# Patient Record
Sex: Female | Born: 1970
Health system: Southern US, Community
[De-identification: ages and names within clinical notes are randomized; demographics above are authoritative.]

## PROBLEM LIST (undated history)

## (undated) DIAGNOSIS — L0291 Cutaneous abscess, unspecified: Secondary | ICD-10-CM

## (undated) DIAGNOSIS — E119 Type 2 diabetes mellitus without complications: Secondary | ICD-10-CM

## (undated) DIAGNOSIS — Z720 Tobacco use: Secondary | ICD-10-CM

---

## 2015-06-03 ENCOUNTER — Inpatient Hospital Stay (HOSPITAL_COMMUNITY)
Admission: EM | Admit: 2015-06-03 | Discharge: 2015-06-10 | DRG: 854 | Disposition: A | Discharge status: Home / Self Care | Payer: Self-pay | Attending: Internal Medicine | Admitting: Internal Medicine

## 2015-06-03 ENCOUNTER — Encounter (HOSPITAL_COMMUNITY): Payer: Self-pay | Admitting: Emergency Medicine

## 2015-06-03 DIAGNOSIS — R739 Hyperglycemia, unspecified: Secondary | ICD-10-CM | POA: Diagnosis present

## 2015-06-03 DIAGNOSIS — L0390 Cellulitis, unspecified: Secondary | ICD-10-CM

## 2015-06-03 DIAGNOSIS — L03312 Cellulitis of back [any part except buttock]: Secondary | ICD-10-CM | POA: Diagnosis present

## 2015-06-03 DIAGNOSIS — E119 Type 2 diabetes mellitus without complications: Secondary | ICD-10-CM

## 2015-06-03 DIAGNOSIS — Z720 Tobacco use: Secondary | ICD-10-CM | POA: Diagnosis present

## 2015-06-03 DIAGNOSIS — R7989 Other specified abnormal findings of blood chemistry: Secondary | ICD-10-CM

## 2015-06-03 DIAGNOSIS — A419 Sepsis, unspecified organism: Principal | ICD-10-CM | POA: Diagnosis present

## 2015-06-03 DIAGNOSIS — F17210 Nicotine dependence, cigarettes, uncomplicated: Secondary | ICD-10-CM | POA: Diagnosis present

## 2015-06-03 DIAGNOSIS — E1165 Type 2 diabetes mellitus with hyperglycemia: Secondary | ICD-10-CM | POA: Diagnosis present

## 2015-06-03 DIAGNOSIS — N179 Acute kidney failure, unspecified: Secondary | ICD-10-CM | POA: Diagnosis present

## 2015-06-03 DIAGNOSIS — Z833 Family history of diabetes mellitus: Secondary | ICD-10-CM

## 2015-06-03 DIAGNOSIS — L02211 Cutaneous abscess of abdominal wall: Secondary | ICD-10-CM | POA: Diagnosis present

## 2015-06-03 DIAGNOSIS — E876 Hypokalemia: Secondary | ICD-10-CM | POA: Diagnosis present

## 2015-06-03 DIAGNOSIS — Z794 Long term (current) use of insulin: Secondary | ICD-10-CM

## 2015-06-03 DIAGNOSIS — K219 Gastro-esophageal reflux disease without esophagitis: Secondary | ICD-10-CM | POA: Diagnosis present

## 2015-06-03 DIAGNOSIS — J449 Chronic obstructive pulmonary disease, unspecified: Secondary | ICD-10-CM | POA: Diagnosis present

## 2015-06-03 DIAGNOSIS — B9562 Methicillin resistant Staphylococcus aureus infection as the cause of diseases classified elsewhere: Secondary | ICD-10-CM | POA: Diagnosis present

## 2015-06-03 DIAGNOSIS — L02212 Cutaneous abscess of back [any part, except buttock]: Secondary | ICD-10-CM | POA: Diagnosis present

## 2015-06-03 DIAGNOSIS — IMO0001 Reserved for inherently not codable concepts without codable children: Secondary | ICD-10-CM | POA: Insufficient documentation

## 2015-06-03 DIAGNOSIS — L0291 Cutaneous abscess, unspecified: Secondary | ICD-10-CM | POA: Diagnosis present

## 2015-06-03 HISTORY — DX: Type 2 diabetes mellitus without complications: E11.9

## 2015-06-03 HISTORY — DX: Cutaneous abscess, unspecified: L02.91

## 2015-06-03 HISTORY — DX: Tobacco use: Z72.0

## 2015-06-03 LAB — CBG MONITORING, ED
GLUCOSE-CAPILLARY: 276 mg/dL — AB (ref 65–99)
Glucose-Capillary: 535 mg/dL — ABNORMAL HIGH (ref 65–99)

## 2015-06-03 LAB — BASIC METABOLIC PANEL
ANION GAP: 16 — AB (ref 5–15)
BUN: 11 mg/dL (ref 6–20)
CALCIUM: 8.8 mg/dL — AB (ref 8.9–10.3)
CO2: 23 mmol/L (ref 22–32)
Chloride: 93 mmol/L — ABNORMAL LOW (ref 101–111)
Creatinine, Ser: 0.55 mg/dL (ref 0.44–1.00)
Glucose, Bld: 511 mg/dL — ABNORMAL HIGH (ref 65–99)
POTASSIUM: 4.1 mmol/L (ref 3.5–5.1)
Sodium: 132 mmol/L — ABNORMAL LOW (ref 135–145)

## 2015-06-03 LAB — CBC
HEMATOCRIT: 38.9 % (ref 36.0–46.0)
HEMOGLOBIN: 13.5 g/dL (ref 12.0–15.0)
MCH: 31.4 pg (ref 26.0–34.0)
MCHC: 34.7 g/dL (ref 30.0–36.0)
MCV: 90.5 fL (ref 78.0–100.0)
Platelets: 346 10*3/uL (ref 150–400)
RBC: 4.3 MIL/uL (ref 3.87–5.11)
RDW: 12.2 % (ref 11.5–15.5)
WBC: 15.5 10*3/uL — ABNORMAL HIGH (ref 4.0–10.5)

## 2015-06-03 MED ORDER — LIDOCAINE-EPINEPHRINE (PF) 2 %-1:200000 IJ SOLN
20.0000 mL | Freq: Once | INTRAMUSCULAR | Status: AC
  Administered 2015-06-04: 20 mL
  Filled 2015-06-03: qty 20

## 2015-06-03 MED ORDER — VANCOMYCIN HCL IN DEXTROSE 1-5 GM/200ML-% IV SOLN
1000.0000 mg | Freq: Once | INTRAVENOUS | Status: AC
  Administered 2015-06-03: 1000 mg via INTRAVENOUS
  Filled 2015-06-03: qty 200

## 2015-06-03 MED ORDER — SODIUM CHLORIDE 0.9 % IV BOLUS (SEPSIS)
1000.0000 mL | Freq: Once | INTRAVENOUS | Status: AC
  Administered 2015-06-03: 1000 mL via INTRAVENOUS

## 2015-06-03 NOTE — ED Notes (Signed)
Pt with draining abscess to middle of back; pt sts hx of similar and has DM

## 2015-06-03 NOTE — ED Provider Notes (Signed)
CSN: [id number]     Arrival date & time 06/03/15  1609 History  By signing my name below, I, Sara Marshall, attest that this documentation has been prepared under the direction and in the presence of Shon Batonourtney F Nickie Warwick, MD. Electronically Signed: Bethel BornBritney Marshall, ED Scribe. 06/04/2015. 1:07 AM   Chief Complaint  Patient presents with  . Abscess    The history is provided by the patient. A language interpreter was used ([place]).   Sara Marshall is a 45 y.o. female with history of DM who presents to the Emergency Department complaining of a painful and draining abscess at the upper mid back with onset 2 days ago. The pain has improved since initial onset. Pt states that she gets frequent abscesses but has never had to be hospitalized for one. Associated symptoms include chills. Pt denies measured fever. She has no PCP. Her DM is treated with pills but she is unsure which ones. Pt states that she does not measure her blood sugar.  NKDA.   Past Medical History  Diagnosis Date  . Diabetes mellitus without complication (HCC)   . Tobacco abuse    History reviewed. No pertinent past surgical history. Family History  Problem Relation Age of Onset  . Diabetes Mother    Social History  Substance Use Topics  . Smoking status: Current Every Day Smoker  . Smokeless tobacco: None  . Alcohol Use: No   OB History    No data available     Review of Systems  Constitutional: Positive for chills. Negative for fever.  Respiratory: Negative for shortness of breath.   Cardiovascular: Negative for chest pain.  Skin: Positive for color change and wound (painful draining abscess at the upper back).  All other systems reviewed and are negative.  Allergies  Review of patient's allergies indicates no known allergies.  Home Medications   Prior to Admission medications   Medication Sig Start Date End Date Taking? Authorizing Provider  PRESCRIPTION MEDICATION Take 1 tablet by mouth 2 (two) times  daily.   Yes Historical Provider, MD   BP 116/74 mmHg  Pulse 107  Temp(Src) 98.3 F (36.8 C) (Oral)  Resp 16  SpO2 98% Physical Exam  Constitutional: She is oriented to person, place, and time. She appears well-developed and well-nourished.  HENT:  Head: Normocephalic and atraumatic.  Cardiovascular: Regular rhythm and normal heart sounds.   tachycardia  Pulmonary/Chest: Effort normal and breath sounds normal. No respiratory distress. She has no wheezes.  Abdominal: Soft. Bowel sounds are normal. There is no tenderness. There is no rebound.  Neurological: She is alert and oriented to person, place, and time.  Skin: Skin is warm and dry.  8 cm indurated lesion just left of the mid thoracic spine, tender to palpation, fluctuant, spontaneous pustular drainage with mild surrounding erythema Additional indurated lesions left lower quadrant approximately 4 cm with mild erythema and pustular drainage noted  Psychiatric: She has a normal mood and affect.  Nursing note and vitals reviewed.   ED Course  .Marland Kitchen.Incision and Drainage Date/Time: 06/04/2015 1:29 AM Performed by: Shon BatonHORTON, Jalani Rominger F Authorized by: Shon BatonHORTON, Levie Wages F Consent: Verbal consent obtained. Risks and benefits: risks, benefits and alternatives were discussed Consent given by: patient Type: abscess Body area: trunk Location details: back Anesthesia: local infiltration Local anesthetic: lidocaine 1% with epinephrine Anesthetic total: 10 ml Patient sedated: no Scalpel size: 11 Incision type: single straight Incision depth: muscle Complexity: simple Drainage: purulent Drainage amount: copious Wound treatment: wound left open Packing material:  1/4 in iodoform gauze   (including critical care time) DIAGNOSTIC STUDIES: Oxygen Saturation is 98% on RA,  normal by my interpretation.    COORDINATION OF CARE: 11:25 PM Discussed treatment plan which includes lab work, IVF, and I&D with pt at bedside and pt agreed to  plan.  1:05 AM-Consult complete with Dr. Clyde Lundborg (Hospitalist). Patient case explained and discussed. Agrees to admit patient for further evaluation and treatment. Call ended at 1:07 AM  Labs Review Labs Reviewed  BASIC METABOLIC PANEL - Abnormal; Notable for the following:    Sodium 132 (*)    Chloride 93 (*)    Glucose, Bld 511 (*)    Calcium 8.8 (*)    Anion gap 16 (*)    All other components within normal limits  CBC - Abnormal; Notable for the following:    WBC 15.5 (*)    All other components within normal limits  CBG MONITORING, ED - Abnormal; Notable for the following:    Glucose-Capillary 535 (*)    All other components within normal limits  CBG MONITORING, ED - Abnormal; Notable for the following:    Glucose-Capillary 276 (*)    All other components within normal limits  CULTURE, BLOOD (ROUTINE X 2)  CULTURE, BLOOD (ROUTINE X 2)  WOUND CULTURE  URINALYSIS, ROUTINE W REFLEX MICROSCOPIC (NOT AT [facility])  PROCALCITONIN  PROTIME-INR  APTT  HIV ANTIBODY (ROUTINE TESTING)  URINE RAPID DRUG SCREEN, HOSP PERFORMED  HEMOGLOBIN A1C  LACTIC ACID, PLASMA  LACTIC ACID, PLASMA  I-STAT CG4 LACTIC ACID, ED  TYPE AND SCREEN    Imaging Review No results found. I have personally reviewed and evaluated these lab results as part of my medical decision-making.   EKG Interpretation None      MDM   Final diagnoses:  Abscess  Hyperglycemia    Patient presents with abscesses most notably to the back and one to the left lower quadrant. Mildly tachycardic and hyperglycemic on initial evaluation. Reports that she takes a "pill for her blood sugar." Patient was given fluids.  Repeat blood sugar 276.  Physical exam is concerning for multiple abscesses which are very large with cellulitis adjacent. While clinically she is afebrile, she does have a leukocytosis to 15.5 and evidence of systemic illness with glucose of 535 with an anion gap of 16. Abscess was I indeed at the bedside. Copious  purulent drainage in the abscess is very deep. Concern for bacteremia given multiple abscesses and leukocytosis. The patient is at high risk for worsening condition. Discussed with the hospitalist. Will admit for observation and antibiotics.  I personally performed the services described in this documentation, which was scribed in my presence. The recorded information has been reviewed and is accurate.    Shon Baton, MD 06/04/15 [phone]

## 2015-06-03 NOTE — ED Notes (Signed)
Pt does not speak [place]. Through interpreter, the following information was obtained: The patient has these abscesses frequently. The one on her back started hurting about 2 days ago. The patient is a diabetic, but is not under the care of a physician. She states she takes pills, but does not check her blood sugar and is not competent of diabetic diet.

## 2015-06-03 NOTE — ED Notes (Addendum)
POCT CBG resulted 535; Shanda BumpsJessica, RN aware

## 2015-06-04 ENCOUNTER — Observation Stay (HOSPITAL_COMMUNITY): Payer: MEDICAID

## 2015-06-04 ENCOUNTER — Observation Stay (HOSPITAL_COMMUNITY): Payer: Self-pay

## 2015-06-04 ENCOUNTER — Encounter (HOSPITAL_COMMUNITY): Payer: Self-pay | Admitting: Internal Medicine

## 2015-06-04 DIAGNOSIS — L0390 Cellulitis, unspecified: Secondary | ICD-10-CM

## 2015-06-04 DIAGNOSIS — A419 Sepsis, unspecified organism: Secondary | ICD-10-CM | POA: Diagnosis present

## 2015-06-04 DIAGNOSIS — Z720 Tobacco use: Secondary | ICD-10-CM | POA: Diagnosis present

## 2015-06-04 DIAGNOSIS — L0291 Cutaneous abscess, unspecified: Secondary | ICD-10-CM | POA: Diagnosis present

## 2015-06-04 DIAGNOSIS — R739 Hyperglycemia, unspecified: Secondary | ICD-10-CM | POA: Diagnosis present

## 2015-06-04 DIAGNOSIS — E119 Type 2 diabetes mellitus without complications: Secondary | ICD-10-CM

## 2015-06-04 LAB — URINE MICROSCOPIC-ADD ON

## 2015-06-04 LAB — GLUCOSE, CAPILLARY
GLUCOSE-CAPILLARY: 284 mg/dL — AB (ref 65–99)
GLUCOSE-CAPILLARY: 276 mg/dL — AB (ref 65–99)
GLUCOSE-CAPILLARY: 217 mg/dL — AB (ref 65–99)
Glucose-Capillary: 242 mg/dL — ABNORMAL HIGH (ref 65–99)
Glucose-Capillary: 310 mg/dL — ABNORMAL HIGH (ref 65–99)
Glucose-Capillary: 342 mg/dL — ABNORMAL HIGH (ref 65–99)

## 2015-06-04 LAB — LACTIC ACID, PLASMA
LACTIC ACID, VENOUS: 1.2 mmol/L (ref 0.5–2.0)
Lactic Acid, Venous: 0.8 mmol/L (ref 0.5–2.0)

## 2015-06-04 LAB — COMPREHENSIVE METABOLIC PANEL
ALT: 17 U/L (ref 14–54)
AST: 15 U/L (ref 15–41)
Albumin: 2.1 g/dL — ABNORMAL LOW (ref 3.5–5.0)
Alkaline Phosphatase: 71 U/L (ref 38–126)
Anion gap: 12 (ref 5–15)
BUN: 8 mg/dL (ref 6–20)
CHLORIDE: 99 mmol/L — AB (ref 101–111)
CO2: 21 mmol/L — ABNORMAL LOW (ref 22–32)
CREATININE: 0.46 mg/dL (ref 0.44–1.00)
Calcium: 7.8 mg/dL — ABNORMAL LOW (ref 8.9–10.3)
Glucose, Bld: 290 mg/dL — ABNORMAL HIGH (ref 65–99)
POTASSIUM: 4.2 mmol/L (ref 3.5–5.1)
Sodium: 132 mmol/L — ABNORMAL LOW (ref 135–145)
Total Bilirubin: 0.4 mg/dL (ref 0.3–1.2)
Total Protein: 5.4 g/dL — ABNORMAL LOW (ref 6.5–8.1)

## 2015-06-04 LAB — URINALYSIS, ROUTINE W REFLEX MICROSCOPIC
BILIRUBIN URINE: NEGATIVE
Glucose, UA: >1000 mg/dL — AB
Ketones, ur: >80 mg/dL — AB
Leukocytes, UA: NEGATIVE
NITRITE: NEGATIVE
PROTEIN: NEGATIVE mg/dL
Specific Gravity, Urine: 1.033 — ABNORMAL HIGH (ref 1.005–1.030)
pH: 6 (ref 5.0–8.0)

## 2015-06-04 LAB — RAPID URINE DRUG SCREEN, HOSP PERFORMED
AMPHETAMINES: NOT DETECTED
BARBITURATES: NOT DETECTED
BENZODIAZEPINES: NOT DETECTED
COCAINE: NOT DETECTED
Opiates: NOT DETECTED
Tetrahydrocannabinol: NOT DETECTED

## 2015-06-04 LAB — HCG, QUANTITATIVE, PREGNANCY: hCG, Beta Chain, Quant, S: <1 m[IU]/mL (ref <5)

## 2015-06-04 LAB — CBC
HEMATOCRIT: 36.1 % (ref 36.0–46.0)
Hemoglobin: 12.2 g/dL (ref 12.0–15.0)
MCH: 30.7 pg (ref 26.0–34.0)
MCHC: 33.8 g/dL (ref 30.0–36.0)
MCV: 90.7 fL (ref 78.0–100.0)
Platelets: 319 10*3/uL (ref 150–400)
RBC: 3.98 MIL/uL (ref 3.87–5.11)
RDW: 12.1 % (ref 11.5–15.5)
WBC: 14.1 10*3/uL — AB (ref 4.0–10.5)

## 2015-06-04 LAB — PROTIME-INR
INR: 1.08 (ref 0.00–1.49)
PROTHROMBIN TIME: 14.2 s (ref 11.6–15.2)

## 2015-06-04 LAB — HIV ANTIBODY (ROUTINE TESTING W REFLEX): HIV Screen 4th Generation wRfx: NONREACTIVE

## 2015-06-04 LAB — PROCALCITONIN: PROCALCITONIN: 0.35 ng/mL

## 2015-06-04 LAB — I-STAT CG4 LACTIC ACID, ED: LACTIC ACID, VENOUS: 1.49 mmol/L (ref 0.5–2.0)

## 2015-06-04 LAB — TYPE AND SCREEN
ABO/RH(D): A POS
Antibody Screen: NEGATIVE

## 2015-06-04 LAB — APTT: APTT: 26 s (ref 24–37)

## 2015-06-04 LAB — ABO/RH: ABO/RH(D): A POS

## 2015-06-04 MED ORDER — ONDANSETRON HCL 4 MG/2ML IJ SOLN
4.0000 mg | Freq: Three times a day (TID) | INTRAMUSCULAR | Status: DC | PRN
  Filled 2015-06-04: qty 2

## 2015-06-04 MED ORDER — PIPERACILLIN-TAZOBACTAM 3.375 G IVPB
3.3750 g | Freq: Three times a day (TID) | INTRAVENOUS | Status: DC
  Administered 2015-06-04 – 2015-06-06 (×8): 3.375 g via INTRAVENOUS
  Filled 2015-06-04 (×12): qty 50

## 2015-06-04 MED ORDER — SODIUM CHLORIDE 0.9 % IV BOLUS (SEPSIS)
2000.0000 mL | Freq: Once | INTRAVENOUS | Status: AC
  Administered 2015-06-04: 2000 mL via INTRAVENOUS

## 2015-06-04 MED ORDER — NICOTINE 21 MG/24HR TD PT24
21.0000 mg | MEDICATED_PATCH | Freq: Every day | TRANSDERMAL | Status: DC
Start: 2015-06-04 — End: 2015-06-10
  Administered 2015-06-04 – 2015-06-10 (×7): 21 mg via TRANSDERMAL
  Filled 2015-06-04 (×7): qty 1

## 2015-06-04 MED ORDER — INSULIN GLARGINE 100 UNIT/ML ~~LOC~~ SOLN
15.0000 [IU] | Freq: Every day | SUBCUTANEOUS | Status: DC
  Administered 2015-06-04 – 2015-06-05 (×2): 15 [IU] via SUBCUTANEOUS
  Filled 2015-06-04 (×4): qty 0.15

## 2015-06-04 MED ORDER — INSULIN GLARGINE 100 UNIT/ML ~~LOC~~ SOLN
5.0000 [IU] | Freq: Every day | SUBCUTANEOUS | Status: DC
  Administered 2015-06-04: 5 [IU] via SUBCUTANEOUS
  Filled 2015-06-04 (×3): qty 0.05

## 2015-06-04 MED ORDER — ENOXAPARIN SODIUM 40 MG/0.4ML ~~LOC~~ SOLN
40.0000 mg | SUBCUTANEOUS | Status: DC
  Administered 2015-06-04 – 2015-06-06 (×2): 40 mg via SUBCUTANEOUS
  Filled 2015-06-04 (×2): qty 0.4

## 2015-06-04 MED ORDER — LIVING WELL WITH DIABETES BOOK - IN SPANISH
Freq: Once | Status: AC
  Administered 2015-06-04: 15:00:00
  Filled 2015-06-04: qty 1

## 2015-06-04 MED ORDER — INSULIN ASPART 100 UNIT/ML ~~LOC~~ SOLN
0.0000 [IU] | Freq: Three times a day (TID) | SUBCUTANEOUS | Status: DC
  Administered 2015-06-04 (×2): 3 [IU] via SUBCUTANEOUS
  Administered 2015-06-04: 7 [IU] via SUBCUTANEOUS
  Administered 2015-06-05: 2 [IU] via SUBCUTANEOUS
  Administered 2015-06-05 (×2): 3 [IU] via SUBCUTANEOUS
  Administered 2015-06-06: 7 [IU] via SUBCUTANEOUS
  Administered 2015-06-06: 5 [IU] via SUBCUTANEOUS
  Administered 2015-06-06: 7 [IU] via SUBCUTANEOUS
  Administered 2015-06-07: 2 [IU] via SUBCUTANEOUS
  Administered 2015-06-07: 5 [IU] via SUBCUTANEOUS
  Administered 2015-06-07: 1 [IU] via SUBCUTANEOUS
  Administered 2015-06-08: 2 [IU] via SUBCUTANEOUS
  Administered 2015-06-08: 3 [IU] via SUBCUTANEOUS
  Administered 2015-06-08: 1 [IU] via SUBCUTANEOUS
  Administered 2015-06-09: 2 [IU] via SUBCUTANEOUS
  Administered 2015-06-09: 1 [IU] via SUBCUTANEOUS
  Administered 2015-06-09: 3 [IU] via SUBCUTANEOUS
  Administered 2015-06-10: 1 [IU] via SUBCUTANEOUS
  Administered 2015-06-10: 3 [IU] via SUBCUTANEOUS

## 2015-06-04 MED ORDER — SODIUM CHLORIDE 0.9 % IV SOLN
INTRAVENOUS | Status: DC
  Administered 2015-06-04 – 2015-06-09 (×9): via INTRAVENOUS

## 2015-06-04 MED ORDER — ACETAMINOPHEN 650 MG RE SUPP: 650.0000 mg | Freq: Four times a day (QID) | RECTAL | Status: DC | PRN

## 2015-06-04 MED ORDER — SODIUM CHLORIDE 0.9% FLUSH
3.0000 mL | Freq: Two times a day (BID) | INTRAVENOUS | Status: DC
  Administered 2015-06-04 – 2015-06-08 (×7): 3 mL via INTRAVENOUS

## 2015-06-04 MED ORDER — OXYCODONE-ACETAMINOPHEN 5-325 MG PO TABS
2.0000 | ORAL_TABLET | Freq: Four times a day (QID) | ORAL | Status: DC | PRN
  Administered 2015-06-08 – 2015-06-09 (×3): 2 via ORAL
  Filled 2015-06-04 (×3): qty 2

## 2015-06-04 MED ORDER — VANCOMYCIN HCL IN DEXTROSE 1-5 GM/200ML-% IV SOLN
1000.0000 mg | Freq: Two times a day (BID) | INTRAVENOUS | Status: DC
  Administered 2015-06-04 – 2015-06-06 (×5): 1000 mg via INTRAVENOUS
  Filled 2015-06-04 (×6): qty 200

## 2015-06-04 MED ORDER — ACETAMINOPHEN 325 MG PO TABS
650.0000 mg | ORAL_TABLET | Freq: Four times a day (QID) | ORAL | Status: DC | PRN
Start: 2015-06-04 — End: 2015-06-10
  Administered 2015-06-04: 650 mg via ORAL
  Filled 2015-06-04: qty 2

## 2015-06-04 MED ORDER — MORPHINE SULFATE (PF) 2 MG/ML IV SOLN
2.0000 mg | INTRAVENOUS | Status: DC | PRN
  Filled 2015-06-04: qty 1

## 2015-06-04 NOTE — Progress Notes (Signed)
Interpreter Wyvonnia DuskyGraciela Namihira for TEPP[employer] PartnersDenise, Physiological scientistinancial [place]

## 2015-06-04 NOTE — Consult Note (Signed)
[facility] Surgery Consult Note  Sara Marshall 11-15-1970  [id number].    Requesting MD: Dr. Clementeen Graham Chief Complaint/Reason for Consult: LLQ abdominal wall and upper back cellulitis/abscess  HPI:  45 y/o Hispanic Spanish speaking female with uncontrolled diabetes from [place] who is here visiting family.  She presented to [facility] secondary to worsening back abscess which occurred spontaneously.  No h/o trauma/insect bites.  She has had abscesses in the past in her back, hips, abdomen and has only had to have them drained once before in the ER.  The back abscess had been going on for >2 weeks and normally they open on their own.  She had been applying hot packs.  She said it opened, but it continued to drain a lot which made her worry.  The abdominal wall abscess in the LLQ had been going on for about 5 days, but it has not opened up.  In the ED, they did an I&D of her back abscess and it has been pouring out purulent material, they packed it with 1/4 inch iodoform gauze packing strips.  She says the pain is dramatically improved and she can actually lay on her back.  She does not see a doctor back home but intends to get one to help her control her blood sugars.  She denied taking any medications for her diabetes.  Denies h/o surgery.  No allergies.  Denies other medical problems.    ROS: All systems reviewed and otherwise negative except for as above  Family History  Problem Relation Age of Onset  . Diabetes Mother     Past Medical History  Diagnosis Date  . Diabetes mellitus without complication ([place])   . Tobacco abuse   . Abscess     History reviewed. No pertinent past surgical history.  Social History:  reports that she has been smoking.  She does not have any smokeless tobacco history on file. She reports that she does not drink alcohol or use illicit drugs.  Allergies: No Known Allergies  Medications Prior to Admission  Medication Sig Dispense Refill  . PRESCRIPTION  MEDICATION Take 1 tablet by mouth 2 (two) times daily.      Blood pressure 122/68, pulse 104, temperature 99.3 F (37.4 C), temperature source Oral, resp. rate 17, weight 77.202 kg (170 lb 3.2 oz), SpO2 100 %. Physical Exam: General: pleasant, WD/WN Hispanic female who is laying in bed in NAD HEENT: head is normocephalic, atraumatic.  Sclera are noninjected.  PERRL.  Ears and nose without any masses or lesions.  Mouth is pink and moist Heart: regular, rate, and rhythm.  No obvious murmurs, gallops, or rubs noted.  Palpable pedal pulses bilaterally Lungs: CTAB, no wheezes, rhonchi, or rales noted.  Respiratory effort nonlabored Abd: soft, NT/ND, +BS, no masses, hernias, or organomegaly MS: all 4 extremities are symmetrical with no cyanosis, clubbing, or edema. Skin: 10 cm indurated, erythematous, macerated abscess over left upper back, tender to palpation, with purulent drainage, no foul smell. Additional LLQ abdominal wall cellulitis which is approximately 5 cm which is indurated, hard, but without palpable fluctuance or active drainage.   Psych: A&Ox3 with an appropriate affect.   Results for orders placed or performed during the hospital encounter of 06/03/15 (from the past 48 hour(s))  POC CBG, ED     Status: Abnormal   Collection Time: 06/03/15  4:24 PM  Result Value Ref Range   Glucose-Capillary 535 (H) 65 - 99 mg/dL   Comment 1 Notify RN  Comment 2 Document in Chart   Basic metabolic panel     Status: Abnormal   Collection Time: 06/03/15  4:55 PM  Result Value Ref Range   Sodium 132 (L) 135 - 145 mmol/L   Potassium 4.1 3.5 - 5.1 mmol/L   Chloride 93 (L) 101 - 111 mmol/L   CO2 23 22 - 32 mmol/L   Glucose, Bld 511 (H) 65 - 99 mg/dL   BUN 11 6 - 20 mg/dL   Creatinine, Ser 0.55 0.44 - 1.00 mg/dL   Calcium 8.8 (L) 8.9 - 10.3 mg/dL   GFR calc non Af Amer >60 >60 mL/min   GFR calc Af Amer >60 >60 mL/min    Comment: (NOTE) The eGFR has been calculated using the CKD EPI  equation. This calculation has not been validated in all clinical situations. eGFR's persistently <60 mL/min signify possible Chronic Kidney Disease.    Anion gap 16 (H) 5 - 15  CBC     Status: Abnormal   Collection Time: 06/03/15  4:55 PM  Result Value Ref Range   WBC 15.5 (H) 4.0 - 10.5 K/uL   RBC 4.30 3.87 - 5.11 MIL/uL   Hemoglobin 13.5 12.0 - 15.0 g/dL   HCT 38.9 36.0 - 46.0 %   MCV 90.5 78.0 - 100.0 fL   MCH 31.4 26.0 - 34.0 pg   MCHC 34.7 30.0 - 36.0 g/dL   RDW 12.2 11.5 - 15.5 %   Platelets 346 150 - 400 K/uL  CBG monitoring, ED     Status: Abnormal   Collection Time: 06/03/15 11:29 PM  Result Value Ref Range   Glucose-Capillary 276 (H) 65 - 99 mg/dL  I-Stat CG4 Lactic Acid, ED     Status: None   Collection Time: 06/04/15 12:22 AM  Result Value Ref Range   Lactic Acid, Venous 1.49 0.5 - 2.0 mmol/L  Wound culture     Status: None (Preliminary result)   Collection Time: 06/04/15 12:45 AM  Result Value Ref Range   Specimen Description ABSCESS    Special Requests BACK    Gram Stain      MODERATE WBC PRESENT, PREDOMINANTLY PMN NO SQUAMOUS EPITHELIAL CELLS SEEN ABUNDANT GRAM POSITIVE COCCI IN CLUSTERS Performed at [facility]    Culture PENDING    Report Status PENDING   ABO/Rh     Status: None   Collection Time: 06/04/15  2:42 AM  Result Value Ref Range   ABO/RH(D) A POS   Procalcitonin     Status: None   Collection Time: 06/04/15  2:44 AM  Result Value Ref Range   Procalcitonin 0.35 ng/mL    Comment:        Interpretation: PCT (Procalcitonin) <= 0.5 ng/mL: Systemic infection (sepsis) is not likely. Local bacterial infection is possible. (NOTE)         ICU PCT Algorithm               Non ICU PCT Algorithm    ----------------------------     ------------------------------         PCT < 0.25 ng/mL                 PCT < 0.1 ng/mL     Stopping of antibiotics            Stopping of antibiotics       strongly encouraged.               strongly  encouraged.    ----------------------------     ------------------------------  PCT level decrease by               PCT < 0.25 ng/mL       >= 80% from peak PCT       OR PCT 0.25 - 0.5 ng/mL          Stopping of antibiotics                                             encouraged.     Stopping of antibiotics           encouraged.    ----------------------------     ------------------------------       PCT level decrease by              PCT >= 0.25 ng/mL       < 80% from peak PCT        AND PCT >= 0.5 ng/mL            Continuin g antibiotics                                              encouraged.       Continuing antibiotics            encouraged.    ----------------------------     ------------------------------     PCT level increase compared          PCT > 0.5 ng/mL         with peak PCT AND          PCT >= 0.5 ng/mL             Escalation of antibiotics                                          strongly encouraged.      Escalation of antibiotics        strongly encouraged.   Protime-INR     Status: None   Collection Time: 06/04/15  2:44 AM  Result Value Ref Range   Prothrombin Time 14.2 11.6 - 15.2 seconds   INR 1.08 0.00 - 1.49  APTT     Status: None   Collection Time: 06/04/15  2:44 AM  Result Value Ref Range   aPTT 26 24 - 37 seconds  Lactic acid, plasma     Status: None   Collection Time: 06/04/15  2:44 AM  Result Value Ref Range   Lactic Acid, Venous 1.2 0.5 - 2.0 mmol/L  Comprehensive metabolic panel     Status: Abnormal   Collection Time: 06/04/15  2:44 AM  Result Value Ref Range   Sodium 132 (L) 135 - 145 mmol/L   Potassium 4.2 3.5 - 5.1 mmol/L   Chloride 99 (L) 101 - 111 mmol/L   CO2 21 (L) 22 - 32 mmol/L   Glucose, Bld 290 (H) 65 - 99 mg/dL   BUN 8 6 - 20 mg/dL   Creatinine, Ser 0.46 0.44 - 1.00 mg/dL   Calcium 7.8 (L) 8.9 - 10.3 mg/dL   Total Protein 5.4 (L) 6.5 - 8.1 g/dL   Albumin 2.1 (L) 3.5 -  5.0 g/dL   AST 15 15 - 41 U/L   ALT 17 14 - 54 U/L    Alkaline Phosphatase 71 38 - 126 U/L   Total Bilirubin 0.4 0.3 - 1.2 mg/dL   GFR calc non Af Amer >60 >60 mL/min   GFR calc Af Amer >60 >60 mL/min    Comment: (NOTE) The eGFR has been calculated using the CKD EPI equation. This calculation has not been validated in all clinical situations. eGFR's persistently <60 mL/min signify possible Chronic Kidney Disease.    Anion gap 12 5 - 15  CBC     Status: Abnormal   Collection Time: 06/04/15  2:44 AM  Result Value Ref Range   WBC 14.1 (H) 4.0 - 10.5 K/uL   RBC 3.98 3.87 - 5.11 MIL/uL   Hemoglobin 12.2 12.0 - 15.0 g/dL   HCT 36.1 36.0 - 46.0 %   MCV 90.7 78.0 - 100.0 fL   MCH 30.7 26.0 - 34.0 pg   MCHC 33.8 30.0 - 36.0 g/dL   RDW 12.1 11.5 - 15.5 %   Platelets 319 150 - 400 K/uL  Type and screen [place]     Status: None   Collection Time: 06/04/15  2:45 AM  Result Value Ref Range   ABO/RH(D) A POS    Antibody Screen NEG    Sample Expiration 06/07/2015   Glucose, capillary     Status: Abnormal   Collection Time: 06/04/15  4:22 AM  Result Value Ref Range   Glucose-Capillary 276 (H) 65 - 99 mg/dL   Comment 1 Notify RN    Comment 2 Document in Chart   Urinalysis, Routine w reflex microscopic (not at [facility])     Status: Abnormal   Collection Time: 06/04/15  5:18 AM  Result Value Ref Range   Color, Urine YELLOW YELLOW   APPearance CLOUDY (A) CLEAR   Specific Gravity, Urine 1.033 (H) 1.005 - 1.030   pH 6.0 5.0 - 8.0   Glucose, UA >1000 (A) NEGATIVE mg/dL   Hgb urine dipstick LARGE (A) NEGATIVE   Bilirubin Urine NEGATIVE NEGATIVE   Ketones, ur >80 (A) NEGATIVE mg/dL   Protein, ur NEGATIVE NEGATIVE mg/dL   Nitrite NEGATIVE NEGATIVE   Leukocytes, UA NEGATIVE NEGATIVE  Urine rapid drug screen (hosp performed)     Status: None   Collection Time: 06/04/15  5:18 AM  Result Value Ref Range   Opiates NONE DETECTED NONE DETECTED   Cocaine NONE DETECTED NONE DETECTED   Benzodiazepines NONE DETECTED NONE DETECTED    Amphetamines NONE DETECTED NONE DETECTED   Tetrahydrocannabinol NONE DETECTED NONE DETECTED   Barbiturates NONE DETECTED NONE DETECTED    Comment:        DRUG SCREEN FOR MEDICAL PURPOSES ONLY.  IF CONFIRMATION IS NEEDED FOR ANY PURPOSE, NOTIFY LAB WITHIN 5 DAYS.        LOWEST DETECTABLE LIMITS FOR URINE DRUG SCREEN Drug Class       Cutoff (ng/mL) Amphetamine      1000 Barbiturate      200 Benzodiazepine   132 Tricyclics       440 Opiates          300 Cocaine          300 THC              50   Urine microscopic-add on     Status: Abnormal   Collection Time: 06/04/15  5:18 AM  Result Value Ref Range  Squamous Epithelial / LPF 0-5 (A) NONE SEEN   WBC, UA 6-30 0 - 5 WBC/hpf   RBC / HPF TOO NUMEROUS TO COUNT 0 - 5 RBC/hpf   Bacteria, UA FEW (A) NONE SEEN  Glucose, capillary     Status: Abnormal   Collection Time: 06/04/15  6:41 AM  Result Value Ref Range   Glucose-Capillary 242 (H) 65 - 99 mg/dL  Lactic acid, plasma     Status: None   Collection Time: 06/04/15  7:04 AM  Result Value Ref Range   Lactic Acid, Venous 0.8 0.5 - 2.0 mmol/L  hCG, quantitative, pregnancy     Status: None   Collection Time: 06/04/15  7:04 AM  Result Value Ref Range   hCG, Beta Chain, Quant, S <1 <5 mIU/mL    Comment:          GEST. AGE      CONC.  (mIU/mL)   <=1 WEEK        5 - 50     2 WEEKS       50 - 500     3 WEEKS       100 - 10,000     4 WEEKS     1,000 - 30,000     5 WEEKS     3,500 - 115,000   6-8 WEEKS     12,000 - 270,000    12 WEEKS     15,000 - 220,000        FEMALE AND NON-PREGNANT FEMALE:     LESS THAN 5 mIU/mL    [place] Misc Soft Tissue  06/04/2015  CLINICAL DATA:  Lower abdominal abscess for 4 days. EXAM: LIMITED ULTRASOUND OF ABDOMINAL SOFT TISSUES TECHNIQUE: Ultrasound examination of the abdominal wall soft tissues was performed in the area of clinical concern. COMPARISON:  None. FINDINGS: Ultrasound examination of the left lower abdomen soft tissues is obtained. The area of  involvement is warm and red. Images obtained demonstrate edema in the subcutaneous soft tissues with multiple hyperechoic foci consistent with soft tissue gas. No discrete loculated fluid collections to suggest abscess. No significant increased flow on color flow Doppler imaging. IMPRESSION: Ultrasound examination of area of abnormality in the left lower abdomen demonstrates soft tissue edema consistent with cellulitis. Soft tissue gas may indicate gas-forming organism or fistula. No discrete abscess collection identified. Electronically Signed   By: Lucienne Capers M.D.   On: 06/04/2015 03:32   [place] Misc Soft Tissue  06/04/2015  CLINICAL DATA:  Draining abscess to the middle of the back for 2 days. EXAM: ULTRASOUND OF CHEST SOFT TISSUES TECHNIQUE: Ultrasound examination of the chest wall soft tissues was performed in the area of clinical concern. COMPARISON:  None. FINDINGS: Ultrasound examination of the left upper back is obtained with imaging of and area of swelling and redness. Images obtained demonstrate subcutaneous soft tissue edema. No discrete loculated fluid collection to suggest an abscess. Areas of mixed echotexture with hyperechoic foci and shadowing consistent with soft tissue gas. No significant hyperemia with color flow Doppler imaging. IMPRESSION: Edema in the subcutaneous fat of the left upper back with soft tissue gas collections present. Changes are consistent with cellulitis. Soft tissue gas could be due to gas-forming organism or fistula. No discrete abscess collection is identified. Electronically Signed   By: Lucienne Capers M.D.   On: 06/04/2015 03:29      Assessment/Plan SIRS Cellulitis/abscess of left upper back, LLQ abdominal wall -Dressing changes with 1/4 iodoform gauze packing  strips to back 3 times a day while draining so much -Recommend daily showers with wounds open -Heat packs to aid in drainage, hopefully the abdominal wound will open on its own, but right now the  area is indurated and no fluctuance to drain -Wound culture pending but gram stain shows gram positive cocci in clusters  Diabetes Mellitus -She does not see a doctor, likely needs diabetes nurse educator & dietitian and Rob Bunting to interpret and she will need care set up in [place] upon discharge -We discussed that her abscesses are a result of her uncontrolled blood sugars.   Nat Christen, [facility] Surgery 06/04/2015, 11:00 AM Pager: [phone] (7am - 4:30pm M-F; 7am - 11:30am Sa/Su)

## 2015-06-04 NOTE — H&P (Addendum)
[facility] History and Physical  Sara QuantCruz Meigs ZOX:[id number] DOB: 02/26/1971 DOA: 06/03/2015  Referring physician: ED physician PCP: No PCP Per Patient  Specialists:   Chief Complaint: abscess over back and abdomen  HPI: Sara Marshall is a 45 y.o. female with PMH of tobacco abuse, diabetes mellitus, recurrent abscess, who presents with abscess over back and abdomen.  Patient speaks Spanish, cannot speak [place]. History is obtained via translated on the phone. Pt reports that she has a painful, big and draining abscess at the upper mid back which started 2 days ago. She also has another smaller abscess over left lower abdomen which started 3 days ago. Patient has subjective fever and chills. No nausea, vomiting, diarrhea, abdominal pain, chest pain, shortness breath, cough, symptoms of UTI or unilateral weakness.  Pt states that she gets frequent abscesses, but has never had to be hospitalized for one. She has no PCP. Her DM is treated with pills but she is unsure which ones. Pt states that she does not measure her blood sugar at home.  In ED, patient was found to have WBC 15.5, lactate 1.49, temperature normal, tachycardia, pseudohyponatremia with sodium 132, anion gap 16, blood sugar 511, bicarbonate 23. Patient is admitted to inpatient for further prevention treatment and observation.  # US-soft tissue of back: edema in the subcutaneous fat of the left upper back with soft tissue gas collections present. Changes are consistent with cellulitis. Soft tissue gas could be due to gas-forming organism or fistula. No discrete abscess collection is identified.  # US-soft tissue of LLQ of abdomen: soft tissue edema consistent with cellulitis. Soft tissue gas may indicate gas-forming organism or fistula. No discrete abscess collection identified.  EKG: Independently reviewed. QTC 413, occasional PVC  Where does patient live?   At home Can patient participate in ADLs?  Yes  Review of Systems:    General: has subjective fevers, chills, no changes in body weight, has fatigue HEENT: no blurry vision, hearing changes or sore throat Pulm: no dyspnea, coughing, wheezing CV: no chest pain, no palpitations Abd: no nausea, vomiting, abdominal pain, diarrhea, constipation GU: no dysuria, burning on urination, increased urinary frequency, hematuria  Ext: no leg edema Neuro: no unilateral weakness, numbness, or tingling, no vision change or hearing loss Skin: has abscess over back and abdomen. MSK: No muscle spasm, no deformity, no limitation of range of movement in spin Heme: No easy bruising.  Travel history: No recent long distant travel.  Allergy: No Known Allergies  Past Medical History  Diagnosis Date  . Diabetes mellitus without complication (HCC)   . Tobacco abuse   . Abscess     History reviewed. No pertinent past surgical history.  Social History:  reports that she has been smoking.  She does not have any smokeless tobacco history on file. She reports that she does not drink alcohol or use illicit drugs.  Family History:  Family History  Problem Relation Age of Onset  . Diabetes Mother      Prior to Admission medications   Not on File    Physical Exam: Filed Vitals:   06/03/15 2354 06/04/15 0206 06/04/15 0324 06/04/15 0408  BP: 103/69 111/73 116/74 122/68  Pulse: 92 96 100 104  Temp: 98.2 F (36.8 C)   98.4 F (36.9 C)  TempSrc: Oral   Oral  Resp: 18 21 21 17   SpO2: 99% 99% 100% 100%   General: Not in acute distress HEENT:       Eyes: PERRL, EOMI, no  scleral icterus.       ENT: No discharge from the ears and nose, no pharynx injection, no tonsillar enlargement.        Neck: No JVD, no bruit, no mass felt. Heme: No neck lymph node enlargement. Cardiac: S1/S2, RRR, No murmurs, No gallops or rubs. Pulm:  No rales, wheezing, rhonchi or rubs. Abd: Soft, nondistended, nontender, no rebound pain, no organomegaly, BS present. Ext: No pitting leg edema  bilaterally. 2+DP/PT pulse bilaterally. Musculoskeletal: No joint deformities, No joint redness or warmth, no limitation of ROM in spin. Skin: 8~9 cm indurated erythematous lesion over left upper back close to thoracic spine, tender to palpation, with spontaneous pustular drainage, looks deep. Additional indurated lesions over left lower abdomen is approximately 4 cm with mild erythema and pustular drainage.  Neuro: Alert, oriented X3, cranial nerves II-XII grossly intact, moves all extremities normally. Psych: Patient is not psychotic, no suicidal or hemocidal ideation.  Labs on Admission:  Basic Metabolic Panel:  Recent Labs Lab 06/03/15 1655 06/04/15 0244  NA 132* 132*  K 4.1 4.2  CL 93* 99*  CO2 23 21*  GLUCOSE 511* 290*  BUN 11 8  CREATININE 0.55 0.46  CALCIUM 8.8* 7.8*   Liver Function Tests:  Recent Labs Lab 06/04/15 0244  AST 15  ALT 17  ALKPHOS 71  BILITOT 0.4  PROT 5.4*  ALBUMIN 2.1*   No results for input(s): LIPASE, AMYLASE in the last 168 hours. No results for input(s): AMMONIA in the last 168 hours. CBC:  Recent Labs Lab 06/03/15 1655 06/04/15 0244  WBC 15.5* 14.1*  HGB 13.5 12.2  HCT 38.9 36.1  MCV 90.5 90.7  PLT 346 319   Cardiac Enzymes: No results for input(s): CKTOTAL, CKMB, CKMBINDEX, TROPONINI in the last 168 hours.  BNP (last 3 results) No results for input(s): BNP in the last 8760 hours.  ProBNP (last 3 results) No results for input(s): PROBNP in the last 8760 hours.  CBG:  Recent Labs Lab 06/03/15 1624 06/03/15 2329 06/04/15 0422  GLUCAP 535* 276* 276*    Radiological Exams on Admission: [place] Misc Soft Tissue  06/04/2015  CLINICAL DATA:  Lower abdominal abscess for 4 days. EXAM: LIMITED ULTRASOUND OF ABDOMINAL SOFT TISSUES TECHNIQUE: Ultrasound examination of the abdominal wall soft tissues was performed in the area of clinical concern. COMPARISON:  None. FINDINGS: Ultrasound examination of the left lower abdomen soft tissues  is obtained. The area of involvement is warm and red. Images obtained demonstrate edema in the subcutaneous soft tissues with multiple hyperechoic foci consistent with soft tissue gas. No discrete loculated fluid collections to suggest abscess. No significant increased flow on color flow Doppler imaging. IMPRESSION: Ultrasound examination of area of abnormality in the left lower abdomen demonstrates soft tissue edema consistent with cellulitis. Soft tissue gas may indicate gas-forming organism or fistula. No discrete abscess collection identified. Electronically Signed   By: Burman Nieves M.D.   On: 06/04/2015 03:32   [place] Misc Soft Tissue  06/04/2015  CLINICAL DATA:  Draining abscess to the middle of the back for 2 days. EXAM: ULTRASOUND OF CHEST SOFT TISSUES TECHNIQUE: Ultrasound examination of the chest wall soft tissues was performed in the area of clinical concern. COMPARISON:  None. FINDINGS: Ultrasound examination of the left upper back is obtained with imaging of and area of swelling and redness. Images obtained demonstrate subcutaneous soft tissue edema. No discrete loculated fluid collection to suggest an abscess. Areas of mixed echotexture with hyperechoic foci and shadowing consistent  with soft tissue gas. No significant hyperemia with color flow Doppler imaging. IMPRESSION: Edema in the subcutaneous fat of the left upper back with soft tissue gas collections present. Changes are consistent with cellulitis. Soft tissue gas could be due to gas-forming organism or fistula. No discrete abscess collection is identified. Electronically Signed   By: Burman Nieves M.D.   On: 06/04/2015 03:29    Assessment/Plan Principal Problem:   Abscess and cellulitis Active Problems:   Diabetes mellitus without complication (HCC)   Tobacco abuse   Sepsis (HCC)   Hyperglycemia   Sepsis due to abscess and cellulitis over left upper back and left lower abdomen: pt is septic with leukocytosis and tachycardia.  Lactate level is normal. Hemodynamic stable. The lesion in left upper back looks deep, may need surgical debridement.   - will admit to tele bed for observation. - Empiric antimicrobial treatment with vancomycin and Zosyn per pharmacy - PRN Zofran for nausea, morphine and Percocet for pain - Blood cultures x 2  - wound care consult - wound culture was sent out by EDP - will get Procalcitonin and trend lactic acid levels per sepsis protocol. - IVF: 3.0L of NS bolus in ED, followed by 125 cc/h  - Preg test - Please call surgeon in AM.   DM-II/hyperglycemia/elevated anion gap: Last A1c is not on record. Not sure what medication patient is taking at home. pt has elevated anion gap of 16, but normal bicarbonate, blood sugar is 511, at risk of developing DKA. -start lantus 5 units daily now -SSI -Check A1c -Aggressive IV fluid treatment: 3L of normal saline bolus, followed by 125 mL per hour  Tobacco abuse: -Did counseling about importance of quitting smoking -Nicotine patch  DVT ppx: SCD  Code Status: Full code Family Communication: None at bed side. Disposition Plan: Admit to inpatient   Date of Service 06/04/2015    Lorretta Harp [facility] Pager [phone]  If 7PM-7AM, please contact night-coverage [email or web address] Password [email or web address] 06/04/2015, 4:46 AM

## 2015-06-04 NOTE — Progress Notes (Signed)
[facility] PROGRESS NOTE  Sara Marshall [id number] DOB: 07/08/1970 DOA: 06/03/2015 PCP: No PCP Per Patient  Reason for to admission H&P done early this morning. 45 year old Hispanic female with history of diabetes mellitus (takes oral medications mother is prescribed, does not see a doctor, does not check CBG), ongoing tobacco use and recurrent abscesses who presented with an abscess over her upper back and abdomen for almost 2 weeks. Patient [place] in [place] and is visiting family here for a week. He reports having an abscess in her upper back that was draining pus for almost 2 weeks. Reports subjective fevers and chills but no nausea, vomiting, abdominal pain, diarrhea, chest or shortness of breath, bowel or urinary symptoms. Patient also noticed pain and swelling on her left lower abdominal wall for the past few days without any discharge or drainage. In the ED patient was afebrile but tachycardic. On ratio 15.5 K, lactate 1.49, sodium of 132, bicarbonate 23, blood glucose of 511 and anion gap of 16. Ultrasound of the upper back and right lower abdominal wall showed trace edema consistent with cellulitis along with soft tissue gas. I&D of the upper back done by ED physician and packing done. Patient admitted to hospital service for further management.   Assessment/Plan: SIRS due to cellulitis and abscess of the left upper back and left lower abdominal wall Patient does not meet criteria for sepsis on admission. I&D of upper back and on admission. Follow wound culture and blood culture. Continue empiric IV vancomycin and Zosyn. Patient still has significant induration on the upper back and induration with tenderness on the left lower abdominal wall. -Continue IV hydration. Pain control when necessary. -I have consulted [place] surgery to  evaluate and likely patient will need further I&D. -Keep nothing by mouth.  Uncontrolled type 2 diabetes mellitus with hyperglycemia and  elevated anion gap Patient reports taking oral pills that has been prescribed to her mother but does not remember the names. She does not check her CBG or see a provider. Will start her on Lantus 15 units daily along with sliding scale coverage. Check A1c. Patient would likely need insulin upon discharge. Needs diabetic teaching.  Tobacco abuse Reports smoking 6 cigarettes a day Counseled strongly on cessation.  DVT Prophylaxis: Subcutaneous Lovenox  Diet: Nothing by mouth   Code Status: Full code Family Communication: None at bedside Disposition Plan: Home once clinically improved   Consultants:  [place] surgery  Procedures:  Ultrasound of the back and left lower abdominal wall  Antibiotics:  IV vancomycin and Zosyn since 3/20  HPI/Subjective: Seen and examined. History obtained through Spanish phone interpreter. Patient complains of pain in her upper back and left lower abdomen. Admission H&P reviewed.  Objective: Filed Vitals:   06/04/15 0408 06/04/15 1001  BP: 122/68   Pulse: 104   Temp: 98.4 F (36.9 C) 99.3 F (37.4 C)  Resp: 17     Intake/Output Summary (Last 24 hours) at 06/04/15 1017 Last data filed at 06/04/15 [id number]  Gross per 24 hour  Intake 447.92 ml  Output   1000 ml  Net -552.08 ml   Filed Weights   06/04/15 0500  Weight: 77.202 kg (170 lb 3.2 oz)    Exam:   General:  Aged female not in distress  HEENT: No pallor, moist mucosa  Chest: Clear bilaterally, no added sounds  Cardiovascular: Normal S1 and S2, no murmurs  GI: Soft, nondistended, erythema with induration over left lower quadrant, no drainage, tender  Musculoskeletal: Warm, no  edema, dressing over upper back with large area of induration, status post I&D with packing done, no discharge but tender  Data Reviewed: Basic Metabolic Panel:  Recent Labs Lab 06/03/15 1655 06/04/15 0244  NA 132* 132*  K 4.1 4.2  CL 93* 99*  CO2 23 21*  GLUCOSE 511* 290*  BUN 11 8   CREATININE 0.55 0.46  CALCIUM 8.8* 7.8*   Liver Function Tests:  Recent Labs Lab 06/04/15 0244  AST 15  ALT 17  ALKPHOS 71  BILITOT 0.4  PROT 5.4*  ALBUMIN 2.1*   No results for input(s): LIPASE, AMYLASE in the last 168 hours. No results for input(s): AMMONIA in the last 168 hours. CBC:  Recent Labs Lab 06/03/15 1655 06/04/15 0244  WBC 15.5* 14.1*  HGB 13.5 12.2  HCT 38.9 36.1  MCV 90.5 90.7  PLT 346 319   Cardiac Enzymes: No results for input(s): CKTOTAL, CKMB, CKMBINDEX, TROPONINI in the last 168 hours. BNP (last 3 results) No results for input(s): BNP in the last 8760 hours.  ProBNP (last 3 results) No results for input(s): PROBNP in the last 8760 hours.  CBG:  Recent Labs Lab 06/03/15 1624 06/03/15 2329 06/04/15 0422 06/04/15 0641  GLUCAP 535* 276* 276* 242*    Recent Results (from the past 240 hour(s))  Wound culture     Status: None (Preliminary result)   Collection Time: 06/04/15 12:45 AM  Result Value Ref Range Status   Specimen Description ABSCESS  Final   Special Requests BACK  Final   Gram Stain   Final    MODERATE WBC PRESENT, PREDOMINANTLY PMN NO SQUAMOUS EPITHELIAL CELLS SEEN ABUNDANT GRAM POSITIVE COCCI IN CLUSTERS Performed at [facility]    Culture PENDING  Incomplete   Report Status PENDING  Incomplete     Studies: [place] Misc Soft Tissue  06/04/2015  CLINICAL DATA:  Lower abdominal abscess for 4 days. EXAM: LIMITED ULTRASOUND OF ABDOMINAL SOFT TISSUES TECHNIQUE: Ultrasound examination of the abdominal wall soft tissues was performed in the area of clinical concern. COMPARISON:  None. FINDINGS: Ultrasound examination of the left lower abdomen soft tissues is obtained. The area of involvement is warm and red. Images obtained demonstrate edema in the subcutaneous soft tissues with multiple hyperechoic foci consistent with soft tissue gas. No discrete loculated fluid collections to suggest abscess. No significant increased flow  on color flow Doppler imaging. IMPRESSION: Ultrasound examination of area of abnormality in the left lower abdomen demonstrates soft tissue edema consistent with cellulitis. Soft tissue gas may indicate gas-forming organism or fistula. No discrete abscess collection identified. Electronically Signed   By: Burman Nieves M.D.   On: 06/04/2015 03:32   [place] Misc Soft Tissue  06/04/2015  CLINICAL DATA:  Draining abscess to the middle of the back for 2 days. EXAM: ULTRASOUND OF CHEST SOFT TISSUES TECHNIQUE: Ultrasound examination of the chest wall soft tissues was performed in the area of clinical concern. COMPARISON:  None. FINDINGS: Ultrasound examination of the left upper back is obtained with imaging of and area of swelling and redness. Images obtained demonstrate subcutaneous soft tissue edema. No discrete loculated fluid collection to suggest an abscess. Areas of mixed echotexture with hyperechoic foci and shadowing consistent with soft tissue gas. No significant hyperemia with color flow Doppler imaging. IMPRESSION: Edema in the subcutaneous fat of the left upper back with soft tissue gas collections present. Changes are consistent with cellulitis. Soft tissue gas could be due to gas-forming organism or fistula. No discrete abscess  collection is identified. Electronically Signed   By: Burman Nieves M.D.   On: 06/04/2015 03:29    Scheduled Meds: . insulin aspart  0-9 Units Subcutaneous TID WC  . insulin glargine  5 Units Subcutaneous QHS  . nicotine  21 mg Transdermal Daily  . piperacillin-tazobactam (ZOSYN)  IV  3.375 g Intravenous Q8H  . sodium chloride flush  3 mL Intravenous Q12H  . vancomycin  1,000 mg Intravenous Q12H   Continuous Infusions: . sodium chloride 125 mL/hr at 06/04/15 0507      Time spent: 20 MINUTES    Brielyn Bosak  [facility] Pager [phone]. If 7PM-7AM, please contact night-coverage at [email or web address], password [facility] 06/04/2015, 10:17 AM

## 2015-06-04 NOTE — ED Notes (Addendum)
Transported to US.

## 2015-06-04 NOTE — ED Notes (Signed)
Dr. Horton at bedside for I&D 

## 2015-06-04 NOTE — Progress Notes (Signed)
  Will see patient with interpreter at 9:45 am on 06/05/15.  Thanks, Beryl MeagerJenny Shekera Beavers, RN, BC-ADM Inpatient Diabetes Coordinator Pager [phone] (8a-5p)

## 2015-06-04 NOTE — Progress Notes (Signed)
Pharmacy Antibiotic Note  Sara Marshall is a 45 y.o. female admitted on 06/03/2015 with abdominal abcesses and cellulitis .  Pharmacy has been consulted for Vancomycin and Zosyn  Dosing.  Vancomycin 1 g IV given in ED at  midnight  Plan: Vancomycin 1 g IV q12h Zosyn 3.375 g IV q8h     Temp (24hrs), Avg:98.2 F (36.8 C), Min:98.1 F (36.7 C), Max:98.3 F (36.8 C)   Recent Labs Lab 06/03/15 1655 06/04/15 0022  WBC 15.5*  --   CREATININE 0.55  --   LATICACIDVEN  --  1.49    CrCl cannot be calculated (Unknown ideal weight.).    No Known Allergies  Antimicrobials this admission: Vancomycin 3/20 >> Zosyn 3/21 >>  Dose adjustments this admission:   Microbiology results:  Sara Marshall, Sara Marshall Sara Marshall 06/04/2015 1:42 AM

## 2015-06-04 NOTE — ED Notes (Signed)
Patient transported to Ultrasound 

## 2015-06-04 NOTE — Progress Notes (Signed)
Pt had shower at around 17 and lst dsg change to back done.  Had 4 hour iv run of zosyn infusing.  Karie Kirks, Therapist, sports.

## 2015-06-04 NOTE — Consult Note (Signed)
WOC consulted for back and abdominal wounds, reviewed US results.  Feel that patient will most likely need surgical intervention.  Spoke with [facility] NP, she will see this patient sometime this am.  I will not consult for this reason.  If [facility] needs further assistance with wound care, they will re-consult our team.   Re consult if needed, will not follow at this time. Thanks  Reshonda Koerber [employer] RN, CWOCN [phone])

## 2015-06-04 NOTE — Progress Notes (Signed)
Inpatient Diabetes Program Recommendations  AACE/ADA: New Consensus Statement on Inpatient Glycemic Control (2015)  Target Ranges:  Prepandial:   less than 140 mg/dL      Peak postprandial:   less than 180 mg/dL (1-2 hours)      Critically ill patients:  140 - 180 mg/dL   Review of Glycemic Control:  Results for Beryle QuantAVARRO, Diantha (MRN [id number]) as of 06/04/2015 10:16  Ref. Range 06/03/2015 16:24 06/03/2015 23:29 06/04/2015 04:22 06/04/2015 06:41  Glucose-Capillary Latest Ref Range: 65-99 mg/dL 409535 (H) 811276 (H) 914276 (H) 242 (H)   Diabetes history:  Diabetes Mellitus Type 2 Outpatient Diabetes medications: None listed Current orders for Inpatient glycemic control:  Novolog sensitive tid with meals, Lantus 5 units q HS  Inpatient Diabetes Program Recommendations:    Note history.  Based on patient's weight, consider increasing Lantus to 15 units daily (0.2 units/kg).  Also consider adding Novolog meal coverage 4 units tid with meals.  Patient will likely need more affordable insulin regimen at discharge.  Consider Novolin 70/30 at D/C.  Will speak with patient today.  Thanks, Beryl MeagerJenny Soriyah Osberg, RN, BC-ADM Inpatient Diabetes Coordinator Pager [phone] (8a-5p)

## 2015-06-05 ENCOUNTER — Inpatient Hospital Stay (HOSPITAL_COMMUNITY): Payer: MEDICAID | Admitting: Anesthesiology

## 2015-06-05 ENCOUNTER — Encounter (HOSPITAL_COMMUNITY): Payer: Self-pay | Admitting: Anesthesiology

## 2015-06-05 ENCOUNTER — Encounter (HOSPITAL_COMMUNITY)
Admission: EM | Disposition: A | Discharge status: Home / Self Care | Payer: Self-pay | Source: Home / Self Care | Attending: Internal Medicine

## 2015-06-05 ENCOUNTER — Inpatient Hospital Stay (HOSPITAL_COMMUNITY): Payer: Self-pay | Admitting: Anesthesiology

## 2015-06-05 DIAGNOSIS — E119 Type 2 diabetes mellitus without complications: Secondary | ICD-10-CM

## 2015-06-05 DIAGNOSIS — A419 Sepsis, unspecified organism: Principal | ICD-10-CM

## 2015-06-05 DIAGNOSIS — L0390 Cellulitis, unspecified: Secondary | ICD-10-CM

## 2015-06-05 DIAGNOSIS — Z794 Long term (current) use of insulin: Secondary | ICD-10-CM

## 2015-06-05 DIAGNOSIS — L0291 Cutaneous abscess, unspecified: Secondary | ICD-10-CM

## 2015-06-05 HISTORY — PX: INCISION AND DRAINAGE ABSCESS: SHX5864

## 2015-06-05 LAB — HEMOGLOBIN A1C
HEMOGLOBIN A1C: 12.5 % — AB (ref 4.8–5.6)
MEAN PLASMA GLUCOSE: 312 mg/dL

## 2015-06-05 LAB — GLUCOSE, CAPILLARY
GLUCOSE-CAPILLARY: 163 mg/dL — AB (ref 65–99)
Glucose-Capillary: 229 mg/dL — ABNORMAL HIGH (ref 65–99)
Glucose-Capillary: 245 mg/dL — ABNORMAL HIGH (ref 65–99)
Glucose-Capillary: 173 mg/dL — ABNORMAL HIGH (ref 65–99)

## 2015-06-05 LAB — MRSA PCR SCREENING: MRSA BY PCR: NEGATIVE

## 2015-06-05 SURGERY — INCISION AND DRAINAGE, ABSCESS
Anesthesia: General

## 2015-06-05 MED ORDER — ONDANSETRON HCL 4 MG/2ML IJ SOLN
INTRAMUSCULAR | Status: DC | PRN
  Administered 2015-06-05: 4 mg via INTRAVENOUS

## 2015-06-05 MED ORDER — SUCCINYLCHOLINE CHLORIDE 20 MG/ML IJ SOLN
INTRAMUSCULAR | Status: DC | PRN
  Administered 2015-06-05: 120 mg via INTRAVENOUS

## 2015-06-05 MED ORDER — INSULIN STARTER KIT- SYRINGES (SPANISH)
1.0000 | Freq: Once | Status: AC
  Administered 2015-06-05: 1
  Filled 2015-06-05: qty 1

## 2015-06-05 MED ORDER — MIDAZOLAM HCL 2 MG/2ML IJ SOLN
INTRAMUSCULAR | Status: AC
  Filled 2015-06-05: qty 2

## 2015-06-05 MED ORDER — MEPERIDINE HCL 25 MG/ML IJ SOLN: 6.2500 mg | INTRAMUSCULAR | Status: DC | PRN

## 2015-06-05 MED ORDER — MIDAZOLAM HCL 2 MG/2ML IJ SOLN: 0.5000 mg | Freq: Once | INTRAMUSCULAR | Status: DC | PRN

## 2015-06-05 MED ORDER — MIDAZOLAM HCL 5 MG/5ML IJ SOLN
INTRAMUSCULAR | Status: DC | PRN
  Administered 2015-06-05: 2 mg via INTRAVENOUS

## 2015-06-05 MED ORDER — LACTATED RINGERS IV SOLN
INTRAVENOUS | Status: DC
  Administered 2015-06-05 (×2): via INTRAVENOUS

## 2015-06-05 MED ORDER — LIDOCAINE HCL (CARDIAC) 20 MG/ML IV SOLN
INTRAVENOUS | Status: DC | PRN
  Administered 2015-06-05: 100 mg via INTRAVENOUS

## 2015-06-05 MED ORDER — HYDROMORPHONE HCL 1 MG/ML IJ SOLN
0.2500 mg | INTRAMUSCULAR | Status: DC | PRN
  Administered 2015-06-05 (×4): 0.5 mg via INTRAVENOUS

## 2015-06-05 MED ORDER — PROPOFOL 10 MG/ML IV BOLUS
INTRAVENOUS | Status: DC | PRN
  Administered 2015-06-05: 200 mg via INTRAVENOUS

## 2015-06-05 MED ORDER — FENTANYL CITRATE (PF) 250 MCG/5ML IJ SOLN
INTRAMUSCULAR | Status: AC
  Filled 2015-06-05: qty 5

## 2015-06-05 MED ORDER — FENTANYL CITRATE (PF) 100 MCG/2ML IJ SOLN
INTRAMUSCULAR | Status: DC | PRN
  Administered 2015-06-05: 100 ug via INTRAVENOUS
  Administered 2015-06-05: 50 ug via INTRAVENOUS

## 2015-06-05 MED ORDER — HYDROMORPHONE HCL 1 MG/ML IJ SOLN
INTRAMUSCULAR | Status: AC
  Filled 2015-06-05: qty 1

## 2015-06-05 MED ORDER — BLOOD GLUCOSE MONITOR KIT: PACK | Status: AC

## 2015-06-05 MED ORDER — PROMETHAZINE HCL 25 MG/ML IJ SOLN: 6.2500 mg | INTRAMUSCULAR | Status: DC | PRN

## 2015-06-05 MED ORDER — SUGAMMADEX SODIUM 200 MG/2ML IV SOLN
INTRAVENOUS | Status: AC
  Filled 2015-06-05: qty 2

## 2015-06-05 MED ORDER — SUGAMMADEX SODIUM 200 MG/2ML IV SOLN
INTRAVENOUS | Status: DC | PRN
  Administered 2015-06-05: 150 mg via INTRAVENOUS

## 2015-06-05 MED ORDER — ROCURONIUM BROMIDE 100 MG/10ML IV SOLN
INTRAVENOUS | Status: DC | PRN
  Administered 2015-06-05: 30 mg via INTRAVENOUS
  Administered 2015-06-05: 10 mg via INTRAVENOUS

## 2015-06-05 SURGICAL SUPPLY — 37 items
BNDG GAUZE ELAST 4 BULKY (GAUZE/BANDAGES/DRESSINGS) IMPLANT
CANISTER SUCTION 2500CC (MISCELLANEOUS) ×3 IMPLANT
CORD ACT VALLEY LAB BOVIE ASPN (MISCELLANEOUS) ×3 IMPLANT
COVER SURGICAL LIGHT HANDLE (MISCELLANEOUS) ×6 IMPLANT
DRAPE LAPAROSCOPIC ABDOMINAL (DRAPES) ×3 IMPLANT
DRAPE LAPAROTOMY 100X72 PEDS (DRAPES) ×3 IMPLANT
DRAPE UTILITY XL STRL (DRAPES) ×9 IMPLANT
DRSG PAD ABDOMINAL 8X10 ST (GAUZE/BANDAGES/DRESSINGS) ×3 IMPLANT
ELECT CAUTERY BLADE 6.4 (BLADE) ×3 IMPLANT
ELECT REM PT RETURN 9FT ADLT (ELECTROSURGICAL) ×3
ELECTRODE REM PT RTRN 9FT ADLT (ELECTROSURGICAL) ×1 IMPLANT
GAUZE SPONGE 4X4 12PLY STRL (GAUZE/BANDAGES/DRESSINGS) IMPLANT
GLOVE BIO SURGEON STRL SZ7 (GLOVE) ×3 IMPLANT
GLOVE BIO SURGEON STRL SZ8 (GLOVE) ×3 IMPLANT
GLOVE BIOGEL PI IND STRL 7.5 (GLOVE) ×1 IMPLANT
GLOVE BIOGEL PI INDICATOR 7.5 (GLOVE) ×2
GLOVE SURG SIGNA 7.5 PF LTX (GLOVE) ×3 IMPLANT
GOWN STRL REUS W/ TWL LRG LVL3 (GOWN DISPOSABLE) ×1 IMPLANT
GOWN STRL REUS W/ TWL XL LVL3 (GOWN DISPOSABLE) ×1 IMPLANT
GOWN STRL REUS W/TWL LRG LVL3 (GOWN DISPOSABLE) ×2
GOWN STRL REUS W/TWL XL LVL3 (GOWN DISPOSABLE) ×2
KIT BASIN OR (CUSTOM PROCEDURE TRAY) ×3 IMPLANT
KIT ROOM TURNOVER OR (KITS) ×3 IMPLANT
NS IRRIG 1000ML POUR BTL (IV SOLUTION) ×3 IMPLANT
PACK GENERAL/GYN (CUSTOM PROCEDURE TRAY) ×3 IMPLANT
PACK UNIVERSAL I (CUSTOM PROCEDURE TRAY) ×3 IMPLANT
PAD ABD 8X10 STRL (GAUZE/BANDAGES/DRESSINGS) ×3 IMPLANT
PAD ARMBOARD 7.5X6 YLW CONV (MISCELLANEOUS) ×6 IMPLANT
SPECIMEN JAR SMALL (MISCELLANEOUS) IMPLANT
SPONGE GAUZE 4X4 12PLY STER LF (GAUZE/BANDAGES/DRESSINGS) ×6 IMPLANT
SWAB COLLECTION DEVICE MRSA (MISCELLANEOUS) ×3 IMPLANT
SWAB CULTURE ESWAB REG 1ML (MISCELLANEOUS) ×3 IMPLANT
TAPE CLOTH SURG 6X10 WHT LF (GAUZE/BANDAGES/DRESSINGS) ×3 IMPLANT
TOWEL OR 17X24 6PK STRL BLUE (TOWEL DISPOSABLE) ×3 IMPLANT
TOWEL OR 17X26 10 PK STRL BLUE (TOWEL DISPOSABLE) ×3 IMPLANT
TUBE ANAEROBIC SPECIMEN COL (MISCELLANEOUS) IMPLANT
YANKAUER SUCT BULB TIP NO VENT (SUCTIONS) ×3 IMPLANT

## 2015-06-05 NOTE — Progress Notes (Signed)
PROGRESS NOTE  Beryle QuantCruz Deshaies ZOX:[id number] DOB: 01/21/1971 DOA: 06/03/2015 PCP: No PCP Per Patient  HPI/Recap of past 24 hours:  Denies pain, fever subsided, npo , awaiting I&D in OR today  Assessment/Plan: Principal Problem:   Abscess and cellulitis Active Problems:   Diabetes mellitus without complication (HCC)   Tobacco abuse   Abscess   Sepsis (HCC)   Hyperglycemia  Sepsis due to abscess and cellulitis over left upper back and left lower abdomen:  pt is septic with leukocytosis and tachycardia. Lactate level is normal. Hemodynamic stable. The lesion in left upper back looks deep, may need surgical debridement.  - Empiric antimicrobial treatment with vancomycin and Zosyn per pharmacy - PRN Zofran for nausea, morphine and Percocet for pain - Blood cultures x 2  - wound care consult - wound culture was sent out by EDP - monitor Procalcitonin and trend lactic acid levels per sepsis protocol. - IVF: 3.0L of NS bolus in ED, followed by 125 cc/h  - Preg test negative -general surgery input appreciated,     DM-II/hyperglycemia/elevated anion gap:  -only on metformin at home per care everywhere, per patient has dm2 for 3559yrs, has been taking her mother's medicines, no pcp. -A1c 12.7 -Aggressive IV fluid treatment: 3L of normal saline bolus, followed by 125 mL per hour, adjust insulin, diabetic education, care manger consult.  Tobacco abuse: -Did counseling about importance of quitting smoking -Nicotine patch  DVT ppx: SCD  Code Status: full  Family Communication: patient   Disposition Plan: remain in the hospital   Consultants:  General surgery  Procedures:  I&D to back and left flank abscess  Antibiotics:  Vanc/ zosyn   Objective: BP 116/70 mmHg  Pulse 88  Temp(Src) 99.1 F (37.3 C) (Oral)  Resp 18  Wt 74.345 kg (163 lb 14.4 oz)  SpO2 98%  Intake/Output Summary (Last 24 hours) at 06/05/15 1036 Last data filed at 06/05/15 1016  Gross per 24  hour  Intake    860 ml  Output    300 ml  Net    560 ml   Filed Weights   06/04/15 0500 06/05/15 0621  Weight: 77.202 kg (170 lb 3.2 oz) 74.345 kg (163 lb 14.4 oz)    Exam:   General:  NAD  Cardiovascular: RRR  Respiratory: CTABL  Abdomen: Soft/ND/NT, positive BS  Musculoskeletal: No Edema  Neuro: aaox3  Skin: upper back and left lower abdominal wall abscesses   Data Reviewed: Basic Metabolic Panel:  Recent Labs Lab 06/03/15 1655 06/04/15 0244  NA 132* 132*  K 4.1 4.2  CL 93* 99*  CO2 23 21*  GLUCOSE 511* 290*  BUN 11 8  CREATININE 0.55 0.46  CALCIUM 8.8* 7.8*   Liver Function Tests:  Recent Labs Lab 06/04/15 0244  AST 15  ALT 17  ALKPHOS 71  BILITOT 0.4  PROT 5.4*  ALBUMIN 2.1*   No results for input(s): LIPASE, AMYLASE in the last 168 hours. No results for input(s): AMMONIA in the last 168 hours. CBC:  Recent Labs Lab 06/03/15 1655 06/04/15 0244  WBC 15.5* 14.1*  HGB 13.5 12.2  HCT 38.9 36.1  MCV 90.5 90.7  PLT 346 319   Cardiac Enzymes:   No results for input(s): CKTOTAL, CKMB, CKMBINDEX, TROPONINI in the last 168 hours. BNP (last 3 results) No results for input(s): BNP in the last 8760 hours.  ProBNP (last 3 results) No results for input(s): PROBNP in the last 8760 hours.  CBG:  Recent Labs Lab  06/04/15 0641 06/04/15 1128 06/04/15 1650 06/04/15 2149 06/05/15 0601  GLUCAP 242* 217* 310* 342* 229*    Recent Results (from the past 240 hour(s))  Wound culture     Status: None (Preliminary result)   Collection Time: 06/04/15 12:45 AM  Result Value Ref Range Status   Specimen Description ABSCESS  Final   Special Requests BACK  Final   Gram Stain   Final    MODERATE WBC PRESENT, PREDOMINANTLY PMN NO SQUAMOUS EPITHELIAL CELLS SEEN ABUNDANT GRAM POSITIVE COCCI IN CLUSTERS Performed at [facility]    Culture PENDING  Incomplete   Report Status PENDING  Incomplete     Studies: No results found.  Scheduled  Meds: . enoxaparin (LOVENOX) injection  40 mg Subcutaneous Q24H  . insulin aspart  0-9 Units Subcutaneous TID WC  . insulin glargine  15 Units Subcutaneous QHS  . nicotine  21 mg Transdermal Daily  . piperacillin-tazobactam (ZOSYN)  IV  3.375 g Intravenous Q8H  . sodium chloride flush  3 mL Intravenous Q12H  . vancomycin  1,000 mg Intravenous Q12H    Continuous Infusions: . sodium chloride 125 mL/hr at 06/04/15 0507     Time spent:  Akshitha Culmer MD, PhD  [facility] Pager [phone]. If 7PM-7AM, please contact night-coverage at [email or web address], password [facility] 06/05/2015, 10:36 AM  LOS: 1 day

## 2015-06-05 NOTE — Progress Notes (Signed)
Interpreter Wyvonnia DuskyGraciela Namihira for [facility]

## 2015-06-05 NOTE — Transfer of Care (Signed)
Immediate Anesthesia Transfer of Care Note  Patient: Sara Marshall  Procedure(s) Performed: Procedure(s): INCISION AND DRAINAGE ABSCESS/ABDOMEN AND BACK (N/A)  Patient Location: PACU  Anesthesia Type:General  Level of Consciousness: awake, alert  and oriented  Airway & Oxygen Therapy: Patient Spontanous Breathing and Patient connected to nasal cannula oxygen  Post-op Assessment: Report given to RN, Post -op Vital signs reviewed and stable and Patient moving all extremities  Post vital signs: Reviewed and stable  Last Vitals:  Filed Vitals:   06/05/15 1154 06/05/15 1910  BP: 106/61   Pulse: 89   Temp: 37.2 C 36.5 C  Resp: 16     Complications: No apparent anesthesia complications

## 2015-06-05 NOTE — Anesthesia Preprocedure Evaluation (Addendum)
Anesthesia Evaluation  Patient identified by MRN, date of birth, ID band Patient awake    Reviewed: Allergy & Precautions, NPO status   History of Anesthesia Complications Negative for: history of anesthetic complications  Airway Mallampati: II  TM Distance: >3 FB Neck ROM: Full    Dental  (+) Chipped, Dental Advisory Given   Pulmonary COPD, Current Smoker,    breath sounds clear to auscultation       Cardiovascular negative cardio ROS   Rhythm:Regular Rate:Normal     Neuro/Psych negative neurological ROS     GI/Hepatic negative GI ROS, Neg liver ROS, neg GERD  ,  Endo/Other  diabetes (glu 173)Newly diagnosed diabetes, not on home meds  Renal/GU negative Renal ROS     Musculoskeletal   Abdominal   Peds  Hematology negative hematology ROS (+)   Anesthesia Other Findings   Reproductive/Obstetrics                            Anesthesia Physical Anesthesia Plan  ASA: III  Anesthesia Plan: General   Post-op Pain Management:    Induction: Intravenous  Airway Management Planned: Oral ETT  Additional Equipment:   Intra-op Plan:   Post-operative Plan: Extubation in OR  Informed Consent: I have reviewed the patients History and Physical, chart, labs and discussed the procedure including the risks, benefits and alternatives for the proposed anesthesia with the patient or authorized representative who has indicated his/her understanding and acceptance.   Dental advisory given  Plan Discussed with: Surgeon and CRNA  Anesthesia Plan Comments: (Plan routine monitors, GETA)        Anesthesia Quick Evaluation

## 2015-06-05 NOTE — Progress Notes (Signed)
RN Marily Memosdna called to see where pt monitor that she was transferred down to short stay is, pt doesn't have the monitor on and this PACU RN did not receive the pt with a monitor.  OR charge RN was called to see if she could look in the OR suite 2 that the procedure was preformed in.  OR charge unable to find the monitor.  Informed RN will go to look in short stay to see if I am able to find it there.  RN positive pt came down with the monitor.  Will continue to look.

## 2015-06-05 NOTE — Op Note (Signed)
Preop diagnosis: Upper back abscess Abdominal wall abscess left lower quadrant Postop diagnosis: Same Procedure performed: 1 sharp debridement of back abscess (skin, subcutaneous tissue, and muscle - total area 7 x 6 x 4 cm) 2.  Sharp debridement of abdominal wall abscess (Skin, subcutaneous tissue - 7 x 7 x 4 cm) Surgeon:  Wynona LunaSUEI,Ryot Burrous K. Anesthesia: Gen. endotracheal Indications: This is a 45 year old  female with uncontrolled diabetes who presented with several weeks of a worsening back as well as a left lower quadrant abdominal wall abscess. Bedside debridement was not effective in draining the abscess adequately. She presents now to the operating room for debridement.  Description of procedure: The patient is brought to the operating room and placed in the supine position on the operating room table. After an adequate level of general anesthesia was obtained, she was turned on her right side on a beanbag. After appropriate padding was placed, her back was prepped with Betadine and draped sterile fashion. There is a 8 cm area of erythema with a central area of necrosis with some purulent drainage. I explored this after taking appropriate timeout. This seems to be a deep abscess with a considerable amount of purulent fluid. We cultured the purulent fluid. We excised skin is obtained tissue to expose the entire abscess. We debrided some necrotic muscle from the base of the wound. There seems to be some purulent fluid tracking in from the surrounding subcutaneous tissue. We extended our excision wider. We debrided until the tissue appeared to be free of any purulent fluid. We irrigated the wound thoroughly and then used cautery for hemostasis. The total area of the debridement cavity measures 7 x 6 cm and 4 cm deep. The wound was packed with 4 x 4 gauze and an ABD dressing was taped over the wound. She was then moved back to a supine position.  We then turned our attention to the left lower quadrant  abdominal wall. The area of the abscess is fairly indurated and measures about 6 cm across. We prepped this area Betadine and draped sterile fashion. Access skin was obtained tissues and we entered a large abscess containing some purulent fluid. We excised skin was obtained tissue to remove the entire abscess cavity. We cut back to healthy adipose tissue. Cautery was used for hemostasis. We irrigated the wound thoroughly and inspected for hemostasis. The wound was packed with 4 x 4 gauze and an APD dressing was taped over the wound. She was brought to the recovery room in stable condition. All sponge, instrument, and needle counts are correct.  Wilmon ArmsMatthew K. Corliss Skainssuei, MD, [facility]  General/ Trauma Surgery  06/05/2015 7:20 PM

## 2015-06-05 NOTE — Progress Notes (Signed)
Consent form explained and signed using interpreter services, Interpreter ID [phone]. Consent for Incision and Drainage of upper back and abdominal wall abcesses to be done by Dr. Magnus IvanBlackman on 06/05/15 at 1650 signed by pt.

## 2015-06-05 NOTE — Progress Notes (Addendum)
Inpatient Diabetes Program Recommendations  AACE/ADA: New Consensus Statement on Inpatient Glycemic Control (2015)  Target Ranges:  Prepandial:   less than 140 mg/dL      Peak postprandial:   less than 180 mg/dL (1-2 hours)      Critically ill patients:  140 - 180 mg/dL   Review of Glycemic Control  Inpatient Diabetes Program Recommendations:   Consult received and coordinator met with pt and interpreter at bedside to discuss A1C 12.5 and DM management.  Pt reports 18+ yr history of DM. States she "used to monitor and control her blood sugar" but has not been recently.  States she has never been on insulin for DM management.  Pt is accepting of insulin therapy at this time.  Explained to pt that bedside RN will instruct on insulin admin at regularly scheduled times.  Pt will need RX for glucose meter, strips, lancets at discharge.  Insulin starter-kit has been ordered.  Pt did not have questions/concerns at the end of our meeting.   Thank you  Raoul Pitch BSN, RN,CDE Inpatient Diabetes Coordinator [phone] (team pager)

## 2015-06-05 NOTE — Anesthesia Postprocedure Evaluation (Signed)
Anesthesia Post Note  Patient: Sara Marshall  Procedure(s) Performed: Procedure(s) (LRB): INCISION AND DRAINAGE ABSCESS/ABDOMEN AND BACK (N/A)  Patient location during evaluation: PACU Anesthesia Type: General Level of consciousness: awake and alert Pain management: pain level controlled Vital Signs Assessment: post-procedure vital signs reviewed and stable Respiratory status: spontaneous breathing, nonlabored ventilation, respiratory function stable and patient connected to nasal cannula oxygen Cardiovascular status: blood pressure returned to baseline and stable Postop Assessment: no signs of nausea or vomiting Anesthetic complications: no    Last Vitals:  Filed Vitals:   06/05/15 1930 06/05/15 1945  BP: 131/82 132/87  Pulse: 83 87  Temp:  36.5 C  Resp: 16 14    Last Pain:  Filed Vitals:   06/05/15 1953  PainSc: 6                  Kyrstin Campillo A

## 2015-06-05 NOTE — Progress Notes (Signed)
Interpreter Wyvonnia DuskyGraciela Namihira for [facility] Diabetes coordinator

## 2015-06-05 NOTE — Progress Notes (Signed)
CM CONSULT Patient is from [place] visiting family members, no PCP/ no insurance; [facility] at discharge until she returns to [place]; Leonard SchwartzB Trappehandler RN,MHA,BSN [phone]

## 2015-06-05 NOTE — Progress Notes (Signed)
Patient ID: Sara Marshall, female   DOB: 09-11-1970, 44 y.o.   MRN: [id number]     [name] [name], [name] [address]    Phone: [phone] FAX: [phone]     Subjective: NPO since MN.  T max 102.3.  Sore.   Objective:  Vital signs:  Filed Vitals:   06/04/15 1244 06/04/15 2150 06/05/15 0131 06/05/15 0621  BP: 118/62 123/79 110/64 116/70  Pulse: 95 112 87 88  Temp: 98 F (36.7 C) 102.3 F (39.1 C) 98.4 F (36.9 C) 99.1 F (37.3 C)  TempSrc: Oral Oral Oral Oral  Resp: _0 Weight:    74.345 kg (163 lb 14.4 oz)  SpO2: 98% 95% 100% 98%    Last BM Date: 06/01/15  Intake/Output   Yesterday:  03/21 0701 - 03/22 0700 In: 860 [P.O.:460; I.V.:300; IV Piggyback:100] Out: 1300 [Urine:1300] This shift: I/O last 3 completed shifts: In: 1307.9 [P.O.:460; I.V.:497.9; IV Piggyback:350] Out: 2300 [Urine:2300]    Physical Exam: General: Pt awake/alert/oriented x4 in no  acute distress Skin:  Upper back-foul smelling purulent drainage, inadequately drained, surrounding induration and cellulitis. Left abdominal wall-significant induration and cellulitis, central opening with purulent drainage.   Problem List:   Principal Problem:   Abscess and cellulitis Active Problems:   Diabetes mellitus without complication ([place])   Tobacco abuse   Abscess   Sepsis ([place])   Hyperglycemia    Results:   Labs: Results for orders placed or performed during the hospital encounter of 06/03/15 (from the past 48 hour(s))  POC CBG, ED     Status: Abnormal   Collection Time: 06/03/15  4:24 PM  Result Value Ref Range   Glucose-Capillary 535 (H) 65 - 99 mg/dL   Comment 1 Notify RN    Comment 2 Document in Chart   Basic metabolic panel     Status: Abnormal   Collection Time: 06/03/15  4:55 PM  Result Value Ref Range   Sodium 132 (L) 135 - 145 mmol/L   Potassium 4.1 3.5 - 5.1 mmol/L   Chloride 93 (L) 101 - 111  mmol/L   CO2 23 22 - 32 mmol/L   Glucose, Bld 511 (H) 65 - 99 mg/dL   BUN 11 6 - 20 mg/dL   Creatinine, Ser 0.55 0.44 - 1.00 mg/dL   Calcium 8.8 (L) 8.9 - 10.3 mg/dL   GFR calc non Af Amer >60 >60 mL/min   GFR calc Af Amer >60 >60 mL/min    Comment: (NOTE) The eGFR has been calculated using the CKD EPI equation. This calculation has not been validated in all clinical situations. eGFR's persistently <60 mL/min signify possible Chronic Kidney Disease.    Anion gap 16 (H) 5 - 15  CBC     Status: Abnormal   Collection Time: 06/03/15  4:55 PM  Result Value Ref Range   WBC 15.5 (H) 4.0 - 10.5 K/uL   RBC 4.30 3.87 - 5.11 MIL/uL   Hemoglobin 13.5 12.0 - 15.0 g/dL   HCT 38.9 36.0 - 46.0 %   MCV 90.5 78.0 - 100.0 fL   MCH 31.4 26.0 - 34.0 pg   MCHC 34.7 30.0 - 36.0 g/dL   RDW 12.2 11.5 - 15.5 %   Platelets 346 150 - 400 K/uL  CBG monitoring, ED     Status: Abnormal   Collection Time: 06/03/15 11:29 PM  Result Value Ref Range  Glucose-Capillary 276 (H) 65 - 99 mg/dL  I-Stat CG4 Lactic Acid, ED     Status: None   Collection Time: 06/04/15 12:22 AM  Result Value Ref Range   Lactic Acid, Venous 1.49 0.5 - 2.0 mmol/L  Wound culture     Status: None (Preliminary result)   Collection Time: 06/04/15 12:45 AM  Result Value Ref Range   Specimen Description ABSCESS    Special Requests BACK    Gram Stain      MODERATE WBC PRESENT, PREDOMINANTLY PMN NO SQUAMOUS EPITHELIAL CELLS SEEN ABUNDANT GRAM POSITIVE COCCI IN CLUSTERS Performed at [facility]    Culture PENDING    Report Status PENDING   Glucose, capillary     Status: Abnormal   Collection Time: 06/04/15  1:36 AM  Result Value Ref Range   Glucose-Capillary 284 (H) 65 - 99 mg/dL  ABO/Rh     Status: None   Collection Time: 06/04/15  2:42 AM  Result Value Ref Range   ABO/RH(D) A POS   Procalcitonin     Status: None   Collection Time: 06/04/15  2:44 AM  Result Value Ref Range   Procalcitonin 0.35 ng/mL    Comment:         Interpretation: PCT (Procalcitonin) <= 0.5 ng/mL: Systemic infection (sepsis) is not likely. Local bacterial infection is possible. (NOTE)         ICU PCT Algorithm               Non ICU PCT Algorithm    ----------------------------     ------------------------------         PCT < 0.25 ng/mL                 PCT < 0.1 ng/mL     Stopping of antibiotics            Stopping of antibiotics       strongly encouraged.               strongly encouraged.    ----------------------------     ------------------------------       PCT level decrease by               PCT < 0.25 ng/mL       >= 80% from peak PCT       OR PCT 0.25 - 0.5 ng/mL          Stopping of antibiotics                                             encouraged.     Stopping of antibiotics           encouraged.    ----------------------------     ------------------------------       PCT level decrease by              PCT >= 0.25 ng/mL       < 80% from peak PCT        AND PCT >= 0.5 ng/mL            Continuin g antibiotics  encouraged.       Continuing antibiotics            encouraged.    ----------------------------     ------------------------------     PCT level increase compared          PCT > 0.5 ng/mL         with peak PCT AND          PCT >= 0.5 ng/mL             Escalation of antibiotics                                          strongly encouraged.      Escalation of antibiotics        strongly encouraged.   Protime-INR     Status: None   Collection Time: 06/04/15  2:44 AM  Result Value Ref Range   Prothrombin Time 14.2 11.6 - 15.2 seconds   INR 1.08 0.00 - 1.49  APTT     Status: None   Collection Time: 06/04/15  2:44 AM  Result Value Ref Range   aPTT 26 24 - 37 seconds  HIV antibody     Status: None   Collection Time: 06/04/15  2:44 AM  Result Value Ref Range   HIV Screen 4th Generation wRfx Non Reactive Non Reactive    Comment: (NOTE) Performed At: [facility] [place], [place] [id number] Lindon Romp MD WU:[id number]   Hemoglobin A1c     Status: Abnormal   Collection Time: 06/04/15  2:44 AM  Result Value Ref Range   Hgb A1c MFr Bld 12.5 (H) 4.8 - 5.6 %    Comment: (NOTE)         Pre-diabetes: 5.7 - 6.4         Diabetes: >6.4         Glycemic control for adults with diabetes: <7.0    Mean Plasma Glucose 312 mg/dL    Comment: (NOTE) Performed At: [facility] [place], [place] [id number] Lindon Romp MD VH:[id number]   Lactic acid, plasma     Status: None   Collection Time: 06/04/15  2:44 AM  Result Value Ref Range   Lactic Acid, Venous 1.2 0.5 - 2.0 mmol/L  Comprehensive metabolic panel     Status: Abnormal   Collection Time: 06/04/15  2:44 AM  Result Value Ref Range   Sodium 132 (L) 135 - 145 mmol/L   Potassium 4.2 3.5 - 5.1 mmol/L   Chloride 99 (L) 101 - 111 mmol/L   CO2 21 (L) 22 - 32 mmol/L   Glucose, Bld 290 (H) 65 - 99 mg/dL   BUN 8 6 - 20 mg/dL   Creatinine, Ser 0.46 0.44 - 1.00 mg/dL   Calcium 7.8 (L) 8.9 - 10.3 mg/dL   Total Protein 5.4 (L) 6.5 - 8.1 g/dL   Albumin 2.1 (L) 3.5 - 5.0 g/dL   AST 15 15 - 41 U/L   ALT 17 14 - 54 U/L   Alkaline Phosphatase 71 38 - 126 U/L   Total Bilirubin 0.4 0.3 - 1.2 mg/dL   GFR calc non Af Amer >60 >60 mL/min   GFR calc Af Amer >60 >60 mL/min    Comment: (NOTE) The eGFR has been calculated using the CKD EPI equation. This calculation has not been validated  in all clinical situations. eGFR's persistently <60 mL/min signify possible Chronic Kidney Disease.    Anion gap 12 5 - 15  CBC     Status: Abnormal   Collection Time: 06/04/15  2:44 AM  Result Value Ref Range   WBC 14.1 (H) 4.0 - 10.5 K/uL   RBC 3.98 3.87 - 5.11 MIL/uL   Hemoglobin 12.2 12.0 - 15.0 g/dL   HCT 36.1 36.0 - 46.0 %   MCV 90.7 78.0 - 100.0 fL   MCH 30.7 26.0 - 34.0 pg   MCHC 33.8 30.0 - 36.0 g/dL   RDW 12.1 11.5 - 15.5 %   Platelets 319 150 - 400 K/uL   Type and screen [place]     Status: None   Collection Time: 06/04/15  2:45 AM  Result Value Ref Range   ABO/RH(D) A POS    Antibody Screen NEG    Sample Expiration 06/07/2015   Glucose, capillary     Status: Abnormal   Collection Time: 06/04/15  4:22 AM  Result Value Ref Range   Glucose-Capillary 276 (H) 65 - 99 mg/dL   Comment 1 Notify RN    Comment 2 Document in Chart   Urinalysis, Routine w reflex microscopic (not at [facility])     Status: Abnormal   Collection Time: 06/04/15  5:18 AM  Result Value Ref Range   Color, Urine YELLOW YELLOW   APPearance CLOUDY (A) CLEAR   Specific Gravity, Urine 1.033 (H) 1.005 - 1.030   pH 6.0 5.0 - 8.0   Glucose, UA >1000 (A) NEGATIVE mg/dL   Hgb urine dipstick LARGE (A) NEGATIVE   Bilirubin Urine NEGATIVE NEGATIVE   Ketones, ur >80 (A) NEGATIVE mg/dL   Protein, ur NEGATIVE NEGATIVE mg/dL   Nitrite NEGATIVE NEGATIVE   Leukocytes, UA NEGATIVE NEGATIVE  Urine rapid drug screen (hosp performed)     Status: None   Collection Time: 06/04/15  5:18 AM  Result Value Ref Range   Opiates NONE DETECTED NONE DETECTED   Cocaine NONE DETECTED NONE DETECTED   Benzodiazepines NONE DETECTED NONE DETECTED   Amphetamines NONE DETECTED NONE DETECTED   Tetrahydrocannabinol NONE DETECTED NONE DETECTED   Barbiturates NONE DETECTED NONE DETECTED    Comment:        DRUG SCREEN FOR MEDICAL PURPOSES ONLY.  IF CONFIRMATION IS NEEDED FOR ANY PURPOSE, NOTIFY LAB WITHIN 5 DAYS.        LOWEST DETECTABLE LIMITS FOR URINE DRUG SCREEN Drug Class       Cutoff (ng/mL) Amphetamine      1000 Barbiturate      200 Benzodiazepine   419 Tricyclics       379 Opiates          300 Cocaine          300 THC              50   Urine microscopic-add on     Status: Abnormal   Collection Time: 06/04/15  5:18 AM  Result Value Ref Range   Squamous Epithelial / LPF 0-5 (A) NONE SEEN   WBC, UA 6-30 0 - 5 WBC/hpf   RBC / HPF TOO NUMEROUS TO COUNT 0 - 5 RBC/hpf    Bacteria, UA FEW (A) NONE SEEN  Glucose, capillary     Status: Abnormal   Collection Time: 06/04/15  6:41 AM  Result Value Ref Range   Glucose-Capillary 242 (H) 65 - 99 mg/dL  Lactic acid, plasma  Status: None   Collection Time: 06/04/15  7:04 AM  Result Value Ref Range   Lactic Acid, Venous 0.8 0.5 - 2.0 mmol/L  hCG, quantitative, pregnancy     Status: None   Collection Time: 06/04/15  7:04 AM  Result Value Ref Range   hCG, Beta Chain, Quant, S <1 <5 mIU/mL    Comment:          GEST. AGE      CONC.  (mIU/mL)   <=1 WEEK        5 - 50     2 WEEKS       50 - 500     3 WEEKS       100 - 10,000     4 WEEKS     1,000 - 30,000     5 WEEKS     3,500 - 115,000   6-8 WEEKS     12,000 - 270,000    12 WEEKS     15,000 - 220,000        FEMALE AND NON-PREGNANT FEMALE:     LESS THAN 5 mIU/mL   Glucose, capillary     Status: Abnormal   Collection Time: 06/04/15 11:28 AM  Result Value Ref Range   Glucose-Capillary 217 (H) 65 - 99 mg/dL  Glucose, capillary     Status: Abnormal   Collection Time: 06/04/15  4:50 PM  Result Value Ref Range   Glucose-Capillary 310 (H) 65 - 99 mg/dL  Glucose, capillary     Status: Abnormal   Collection Time: 06/04/15  9:49 PM  Result Value Ref Range   Glucose-Capillary 342 (H) 65 - 99 mg/dL  Glucose, capillary     Status: Abnormal   Collection Time: 06/05/15  6:01 AM  Result Value Ref Range   Glucose-Capillary 229 (H) 65 - 99 mg/dL    Imaging / Studies: [place] Misc Soft Tissue  06/04/2015  CLINICAL DATA:  Lower abdominal abscess for 4 days. EXAM: LIMITED ULTRASOUND OF ABDOMINAL SOFT TISSUES TECHNIQUE: Ultrasound examination of the abdominal wall soft tissues was performed in the area of clinical concern. COMPARISON:  None. FINDINGS: Ultrasound examination of the left lower abdomen soft tissues is obtained. The area of involvement is warm and red. Images obtained demonstrate edema in the subcutaneous soft tissues with multiple hyperechoic foci consistent with  soft tissue gas. No discrete loculated fluid collections to suggest abscess. No significant increased flow on color flow Doppler imaging. IMPRESSION: Ultrasound examination of area of abnormality in the left lower abdomen demonstrates soft tissue edema consistent with cellulitis. Soft tissue gas may indicate gas-forming organism or fistula. No discrete abscess collection identified. Electronically Signed   By: Lucienne Capers M.D.   On: 06/04/2015 03:32   [place] Misc Soft Tissue  06/04/2015  CLINICAL DATA:  Draining abscess to the middle of the back for 2 days. EXAM: ULTRASOUND OF CHEST SOFT TISSUES TECHNIQUE: Ultrasound examination of the chest wall soft tissues was performed in the area of clinical concern. COMPARISON:  None. FINDINGS: Ultrasound examination of the left upper back is obtained with imaging of and area of swelling and redness. Images obtained demonstrate subcutaneous soft tissue edema. No discrete loculated fluid collection to suggest an abscess. Areas of mixed echotexture with hyperechoic foci and shadowing consistent with soft tissue gas. No significant hyperemia with color flow Doppler imaging. IMPRESSION: Edema in the subcutaneous fat of the left upper back with soft tissue gas collections present. Changes are consistent with cellulitis. Soft tissue gas  could be due to gas-forming organism or fistula. No discrete abscess collection is identified. Electronically Signed   By: Lucienne Capers M.D.   On: 06/04/2015 03:29    Medications / Allergies:  Scheduled Meds: . enoxaparin (LOVENOX) injection  40 mg Subcutaneous Q24H  . insulin aspart  0-9 Units Subcutaneous TID WC  . insulin glargine  15 Units Subcutaneous QHS  . nicotine  21 mg Transdermal Daily  . piperacillin-tazobactam (ZOSYN)  IV  3.375 g Intravenous Q8H  . sodium chloride flush  3 mL Intravenous Q12H  . vancomycin  1,000 mg Intravenous Q12H   Continuous Infusions: . sodium chloride 125 mL/hr at 06/04/15 0507   PRN  Meds:.acetaminophen **OR** acetaminophen, morphine injection, ondansetron, oxyCODONE-acetaminophen  Antibiotics: Anti-infectives    Start     Dose/Rate Route Frequency Ordered Stop   06/04/15 0600  vancomycin (VANCOCIN) IVPB 1000 mg/200 mL premix     1,000 mg 200 mL/hr over 60 Minutes Intravenous Every 12 hours 06/04/15 0553     06/04/15 0200  piperacillin-tazobactam (ZOSYN) IVPB 3.375 g     3.375 g 12.5 mL/hr over 240 Minutes Intravenous Every 8 hours 06/04/15 0146     06/03/15 2330  vancomycin (VANCOCIN) IVPB 1000 mg/200 mL premix     1,000 mg 200 mL/hr over 60 Minutes Intravenous  Once 06/03/15 2327 06/04/15 0052        Assessment/Plan Abscess with cellulitis back and left flank -dont think either one is drained adequately.  I&D in the OR.  Graciella at bedside to translate.  Surgical risks discussed including infection, bleeding, anesthesia risks, heart attack, DVT/PE and respiratory compromise.  The patient verbalizes understanding and wishes to proceed.   ID-Vanc/zosyn DM II-adequate glucose control to help with infection.  FEN-NPO, IVF VTE prophylaxis-SCD/lovenox Dispo-OR, would prefer to move the patient to 6N post operatively   Erby Pian, [facility] Surgery Pager [phone](7A-4:30P) For consults and floor pages call [phone](7A-4:30P)  06/05/2015 9:30 AM

## 2015-06-05 NOTE — Progress Notes (Signed)
Short Stay room 36 was the room the pt was taken to directly from her room on 3E.  This nurse personally searched the room to see if the monitor was there and it was not.  [name], RN on 3E was called back to notify her of this.  Will continue to look for monitor and call RN if I find it.

## 2015-06-06 ENCOUNTER — Encounter (HOSPITAL_COMMUNITY): Payer: Self-pay | Admitting: Surgery

## 2015-06-06 LAB — LIPID PANEL
Cholesterol: 109 mg/dL (ref 0–200)
Cholesterol: 109 mg/dL (ref 0–200)
HDL: 17 mg/dL — ABNORMAL LOW (ref >40)
HDL: 17 mg/dL — AB (ref >40)
LDL CALC: 57 mg/dL (ref 0–99)
LDL CALC: 57 mg/dL (ref 0–99)
Total CHOL/HDL Ratio: 6.4 RATIO
Total CHOL/HDL Ratio: 6.4 RATIO
Triglycerides: 175 mg/dL — ABNORMAL HIGH (ref <150)
Triglycerides: 176 mg/dL — ABNORMAL HIGH (ref <150)
VLDL: 35 mg/dL (ref 0–40)
VLDL: 35 mg/dL (ref 0–40)

## 2015-06-06 LAB — VANCOMYCIN, RANDOM: VANCOMYCIN RM: 16 ug/mL

## 2015-06-06 LAB — BASIC METABOLIC PANEL
Anion gap: 11 (ref 5–15)
BUN: 12 mg/dL (ref 6–20)
CALCIUM: 7.9 mg/dL — AB (ref 8.9–10.3)
CO2: 25 mmol/L (ref 22–32)
Chloride: 103 mmol/L (ref 101–111)
Creatinine, Ser: 1.19 mg/dL — ABNORMAL HIGH (ref 0.44–1.00)
GFR calc Af Amer: >60 mL/min (ref >60)
GFR, EST NON AFRICAN AMERICAN: 55 mL/min — AB (ref >60)
GLUCOSE: 294 mg/dL — AB (ref 65–99)
Potassium: 3.3 mmol/L — ABNORMAL LOW (ref 3.5–5.1)
Sodium: 139 mmol/L (ref 135–145)

## 2015-06-06 LAB — GLUCOSE, CAPILLARY
GLUCOSE-CAPILLARY: 258 mg/dL — AB (ref 65–99)
GLUCOSE-CAPILLARY: 342 mg/dL — AB (ref 65–99)
Glucose-Capillary: 320 mg/dL — ABNORMAL HIGH (ref 65–99)
Glucose-Capillary: 269 mg/dL — ABNORMAL HIGH (ref 65–99)

## 2015-06-06 LAB — MAGNESIUM: Magnesium: 1.7 mg/dL (ref 1.7–2.4)

## 2015-06-06 LAB — WOUND CULTURE

## 2015-06-06 LAB — CBC
HCT: 34.5 % — ABNORMAL LOW (ref 36.0–46.0)
Hemoglobin: 11.6 g/dL — ABNORMAL LOW (ref 12.0–15.0)
MCH: 30.9 pg (ref 26.0–34.0)
MCHC: 33.6 g/dL (ref 30.0–36.0)
MCV: 92 fL (ref 78.0–100.0)
PLATELETS: 336 10*3/uL (ref 150–400)
RBC: 3.75 MIL/uL — ABNORMAL LOW (ref 3.87–5.11)
RDW: 12.3 % (ref 11.5–15.5)
WBC: 13 10*3/uL — ABNORMAL HIGH (ref 4.0–10.5)

## 2015-06-06 LAB — TSH: TSH: 0.425 u[IU]/mL (ref 0.350–4.500)

## 2015-06-06 MED ORDER — INSULIN GLARGINE 100 UNIT/ML ~~LOC~~ SOLN
20.0000 [IU] | Freq: Every day | SUBCUTANEOUS | Status: DC
  Administered 2015-06-06 – 2015-06-09 (×4): 20 [IU] via SUBCUTANEOUS
  Filled 2015-06-06 (×7): qty 0.2

## 2015-06-06 MED ORDER — INSULIN ASPART 100 UNIT/ML ~~LOC~~ SOLN
3.0000 [IU] | Freq: Three times a day (TID) | SUBCUTANEOUS | Status: DC
  Administered 2015-06-07 – 2015-06-10 (×9): 3 [IU] via SUBCUTANEOUS

## 2015-06-06 MED ORDER — POTASSIUM CHLORIDE CRYS ER 20 MEQ PO TBCR
40.0000 meq | EXTENDED_RELEASE_TABLET | Freq: Once | ORAL | Status: AC
  Administered 2015-06-06: 40 meq via ORAL
  Filled 2015-06-06: qty 2

## 2015-06-06 MED ORDER — DOXYCYCLINE HYCLATE 100 MG PO TABS: 100.0000 mg | ORAL_TABLET | Freq: Two times a day (BID) | ORAL | Status: DC

## 2015-06-06 MED ORDER — DOXYCYCLINE HYCLATE 100 MG PO TABS
100.0000 mg | ORAL_TABLET | Freq: Two times a day (BID) | ORAL | Status: DC
  Administered 2015-06-06 – 2015-06-10 (×8): 100 mg via ORAL
  Filled 2015-06-06 (×8): qty 1

## 2015-06-06 MED ORDER — INSULIN NPH ISOPHANE & REGULAR (70-30) 100 UNIT/ML ~~LOC~~ SUSP: 10.0000 [IU] | Freq: Two times a day (BID) | SUBCUTANEOUS | Status: DC

## 2015-06-06 NOTE — Progress Notes (Signed)
CRITICAL VALUE ALERT  Critical value received:  Wound culture MRSA positive.   Date of notification:  06/06/2015  Time of notification:  0913  Critical value read back:Yes.    Nurse who received alert:  Nicolette BangB. Nikolette Reindl, RN  MD notified (1st page):  Dr. Roda ShuttersXu  Time of first page:  0913  MD notified (2nd page):  Time of second page:  Responding MD:  Dr. Roda ShuttersXU  Time MD responded:  [phone]

## 2015-06-06 NOTE — Progress Notes (Signed)
1 Day Post-Op  Subjective: comfortable  Objective: Vital signs in last 24 hours: Temp:  [97.7 F (36.5 C)-98.9 F (37.2 C)] 98.7 F (37.1 C) (03/23 0609) Pulse Rate:  [79-93] 93 (03/23 0609) Resp:  [10-18] 18 (03/23 0609) BP: (106-139)/(61-87) 119/68 mmHg (03/23 0609) SpO2:  [97 %-100 %] 99 % (03/23 0609) Weight:  [74.39 kg (164 lb)] 74.39 kg (164 lb) (03/23 0609) Last BM Date: 06/01/15  Intake/Output from previous day: 03/22 0701 - 03/23 0700 In: 1800 [I.V.:800; IV Piggyback:1000] Out: -  Intake/Output this shift:    Back and abdominal wound fairly clean  Lab Results:   Recent Labs  06/04/15 0244 06/06/15 0330  WBC 14.1* 13.0*  HGB 12.2 11.6*  HCT 36.1 34.5*  PLT 319 336   BMET  Recent Labs  06/04/15 0244 06/06/15 0330  NA 132* 139  K 4.2 3.3*  CL 99* 103  CO2 21* 25  GLUCOSE 290* 294*  BUN 8 12  CREATININE 0.46 1.19*  CALCIUM 7.8* 7.9*   PT/INR  Recent Labs  06/04/15 0244  LABPROT 14.2  INR 1.08   ABG No results for input(s): PHART, HCO3 in the last 72 hours.  Invalid input(s): PCO2, PO2  Studies/Results: No results found.  Anti-infectives: Anti-infectives    Start     Dose/Rate Route Frequency Ordered Stop   06/04/15 0600  vancomycin (VANCOCIN) IVPB 1000 mg/200 mL premix     1,000 mg 200 mL/hr over 60 Minutes Intravenous Every 12 hours 06/04/15 0553     06/04/15 0200  piperacillin-tazobactam (ZOSYN) IVPB 3.375 g     3.375 g 12.5 mL/hr over 240 Minutes Intravenous Every 8 hours 06/04/15 0146     06/03/15 2330  vancomycin (VANCOCIN) IVPB 1000 mg/200 mL premix     1,000 mg 200 mL/hr over 60 Minutes Intravenous  Once 06/03/15 2327 06/04/15 0052      Assessment/Plan: s/p Procedure(s): INCISION AND DRAINAGE ABSCESS/ABDOMEN AND BACK (N/A)  WBC decreased Continue wet to dry dressing changes.  May need home health for dressing changes at discharge  LOS: 2 days    Maziah Smola A 06/06/2015

## 2015-06-06 NOTE — Progress Notes (Signed)
Inpatient Diabetes Program Recommendations  AACE/ADA: New Consensus Statement on Inpatient Glycemic Control (2015)  Target Ranges:  Prepandial:   less than 140 mg/dL      Peak postprandial:   less than 180 mg/dL (1-2 hours)      Critically ill patients:  140 - 180 mg/dL   Review of Glycemic Control:  Results for Sara Marshall, Sara Marshall (MRN [id number]) as of 06/06/2015 10:59  Ref. Range 06/05/2015 06:01 06/05/2015 11:53 06/05/2015 16:44 06/05/2015 19:13 06/06/2015 06:54  Glucose-Capillary Latest Ref Range: 65-99 mg/dL 409229 (H) 811245 (H) 914173 (H) 163 (H) 258 (H)    Diabetes history: Type 2 diabetes-Never on insulin Outpatient Diabetes medications: None Current orders for Inpatient glycemic control:  Lantus 15 units q HS, Novolog sensitive tid with meals  Inpatient Diabetes Program Recommendations:    Note that fasting CBG remains greater than goal.  Consider increasing Lantus to 20 units daily and add Novolog meal coverage 3 units tid with meals.   At discharge, may need more affordable insulin regimen such as Novolin 70/30.    Thanks, Sara MeagerJenny Ileen Kahre, RN, BC-ADM Inpatient Diabetes Coordinator Pager [phone] (8a-5p)

## 2015-06-06 NOTE — Progress Notes (Signed)
PROGRESS NOTE  Sara Marshall WUJ:[id number] DOB: [birth year] DOA: 06/03/2015 PCP: No PCP Per Patient  HPI/Recap of past 24 hours:  S/p I&D on 3/22, Denies pain, no fever   Assessment/Plan: Principal Problem:   Abscess and cellulitis Active Problems:   Diabetes mellitus without complication (HCC)   Tobacco abuse   Abscess   Sepsis (HCC)   Hyperglycemia  Sepsis due to abscess and cellulitis over left upper back and left lower abdomen:  pt is septic with leukocytosis and tachycardia. Lactate level is normal. Hemodynamic stable. The lesion in left upper back looks deep, may need surgical debridement.  - Empiric antimicrobial treatment with vancomycin and Zosyn per pharmacy - PRN Zofran for nausea, morphine and Percocet for pain - Blood cultures x 2 , no growth - wound care consult - wound culture was sent out by EDP, + MRSA - monitor Procalcitonin and trend lactic acid levels per sepsis protocol. - IVF: 3.0L of NS bolus in ED, followed by 125 cc/h  - Preg test negative -general surgery input appreciated,   s/p i&d on 3/23, abx adjusted , changed to oral doxycycline.   DM-II/hyperglycemia/elevated anion gap:  -only on metformin at home per care everywhere, per patient has dm2 for 30yrs, has been taking her mother's medicines, no pcp. -A1c 12.7 -Aggressive IV fluid treatment: 3L of normal saline bolus, followed by 125 mL per hour, adjust insulin, diabetic education, care manger consult. -continue adjust insulin  AKI: cr 1.19, ua no infection, continue ivf, avoid nephrotoxin, d/c zosyn, changed to oral doxy, repeat bmp in am  Hypokalemia: replace k  Tobacco abuse: -Did counseling about importance of quitting smoking -Nicotine patch  DVT ppx: SCD  Code Status: full  Family Communication: patient   Disposition Plan: possible discharge on 3/24   Consultants:  General surgery  Procedures:  I&D to back and left flank abscess on 3/22  Antibiotics:  Vanc/ zosyn  from admission to 3/23  Doxycycline from 3/23   Objective: BP 119/74 mmHg  Pulse 86  Temp(Src) 99.8 F (37.7 C) (Oral)  Resp 18  Ht  (1.651 m)  Wt 74.39 kg (164 lb)  BMI 27.29 kg/m2  SpO2 98%  Intake/Output Summary (Last 24 hours) at 06/06/15 1642 Last data filed at 06/06/15 1300  Gross per 24 hour  Intake   2140 ml  Output   1900 ml  Net    240 ml   Filed Weights   06/04/15 0500 06/05/15 0621 06/06/15 0609  Weight: 77.202 kg (170 lb 3.2 oz) 74.345 kg (163 lb 14.4 oz) 74.39 kg (164 lb)    Exam:   General:  NAD  Cardiovascular: RRR  Respiratory: CTABL  Abdomen: Soft/ND/NT, positive BS  Musculoskeletal: No Edema  Neuro: aaox3  Skin: upper back and left lower abdominal wall abscesses s/p i&d, dressing intact  Data Reviewed: Basic Metabolic Panel:  Recent Labs Lab 06/03/15 1655 06/04/15 0244 06/06/15 0330  NA 132* 132* 139  K 4.1 4.2 3.3*  CL 93* 99* 103  CO2 23 21* 25  GLUCOSE 511* 290* 294*  BUN CREATININE 0.55 0.46 1.19*  CALCIUM 8.8* 7.8* 7.9*  MG  --   --  1.7   Liver Function Tests:  Recent Labs Lab 06/04/15 0244  AST 15  ALT 17  ALKPHOS 71  BILITOT 0.4  PROT 5.4*  ALBUMIN 2.1*   No results for input(s): LIPASE, AMYLASE in the last 168 hours. No results for input(s): AMMONIA in the last  168 hours. CBC:  Recent Labs Lab 06/03/15 1655 06/04/15 0244 06/06/15 0330  WBC 15.5* 14.1* 13.0*  HGB 13.5 12.2 11.6*  HCT 38.9 36.1 34.5*  MCV 90.5 90.7 92.0  PLT 346 319 336   Cardiac Enzymes:   No results for input(s): CKTOTAL, CKMB, CKMBINDEX, TROPONINI in the last 168 hours. BNP (last 3 results) No results for input(s): BNP in the last 8760 hours.  ProBNP (last 3 results) No results for input(s): PROBNP in the last 8760 hours.  CBG:  Recent Labs Lab 06/05/15 1644 06/05/15 1913 06/06/15 0654 06/06/15 1120 06/06/15 1618  GLUCAP 173* 163* 258* 342* 320*    Recent Results (from the past 240 hour(s))  Blood  culture (routine x 2)     Status: None (Preliminary result)   Collection Time: 06/03/15 11:51 PM  Result Value Ref Range Status   Specimen Description BLOOD RIGHT ARM  Final   Special Requests BOTTLES DRAWN AEROBIC AND ANAEROBIC  Final   Culture NO GROWTH 2 DAYS  Final   Report Status PENDING  Incomplete  Blood culture (routine x 2)     Status: None (Preliminary result)   Collection Time: 06/03/15 11:57 PM  Result Value Ref Range Status   Specimen Description BLOOD LEFT ARM  Final   Special Requests BOTTLES DRAWN AEROBIC AND ANAEROBIC  Final   Culture NO GROWTH 2 DAYS  Final   Report Status PENDING  Incomplete  Wound culture     Status: None   Collection Time: 06/04/15 12:45 AM  Result Value Ref Range Status   Specimen Description ABSCESS  Final   Special Requests BACK  Final   Gram Stain   Final    MODERATE WBC PRESENT, PREDOMINANTLY PMN NO SQUAMOUS EPITHELIAL CELLS SEEN ABUNDANT GRAM POSITIVE COCCI IN CLUSTERS Performed at [facility]    Culture   Final    ABUNDANT METHICILLIN RESISTANT STAPHYLOCOCCUS AUREUS Note: RIFAMPIN AND GENTAMICIN SHOULD NOT BE USED AS SINGLE DRUGS FOR TREATMENT OF STAPH INFECTIONS. This organism DOES NOT demonstrate inducible Clindamycin resistance in vitro. CRITICAL RESULT CALLED TO, READ BACK BY AND VERIFIED WITH: BLAIR Q. AT  9:13AM ON 06/06/2015 [name] Performed at [facility]    Report Status 06/06/2015 FINAL  Final   Organism ID, Bacteria METHICILLIN RESISTANT STAPHYLOCOCCUS AUREUS  Final      Susceptibility   Methicillin resistant staphylococcus aureus - MIC*    CLINDAMYCIN <=0.25 SENSITIVE Sensitive     ERYTHROMYCIN >=8 RESISTANT Resistant     GENTAMICIN <=0.5 SENSITIVE Sensitive     LEVOFLOXACIN 0.25 SENSITIVE Sensitive     OXACILLIN >=4 RESISTANT Resistant     RIFAMPIN <=0.5 SENSITIVE Sensitive     TRIMETH/SULFA <=10 SENSITIVE Sensitive     VANCOMYCIN <=0.5 SENSITIVE Sensitive     TETRACYCLINE <=1 SENSITIVE  Sensitive     * ABUNDANT METHICILLIN RESISTANT STAPHYLOCOCCUS AUREUS  MRSA PCR Screening     Status: None   Collection Time: 06/05/15 10:38 AM  Result Value Ref Range Status   MRSA by PCR NEGATIVE NEGATIVE Final    Comment:        The GeneXpert MRSA Assay (FDA approved for NASAL specimens only), is one component of a comprehensive MRSA colonization surveillance program. It is not intended to diagnose MRSA infection nor to guide or monitor treatment for MRSA infections.   Culture, routine-abscess     Status: None (Preliminary result)   Collection Time: 06/05/15 10:03 PM  Result Value Ref Range Status  Specimen Description ABSCESS BACK  Final   Special Requests NONE  Final   Gram Stain   Final    ABUNDANT WBC PRESENT, PREDOMINANTLY PMN NO SQUAMOUS EPITHELIAL CELLS SEEN ABUNDANT GRAM POSITIVE COCCI IN CLUSTERS [facility]    Culture PENDING  Incomplete   Report Status PENDING  Incomplete     Studies: No results found.  Scheduled Meds: . doxycycline  100 mg Oral Q12H  . enoxaparin (LOVENOX) injection  40 mg Subcutaneous Q24H  . insulin aspart  0-9 Units Subcutaneous TID WC  . [START ON 06/07/2015] insulin aspart  3 Units Subcutaneous TID WC  . insulin glargine  20 Units Subcutaneous QHS  . nicotine  21 mg Transdermal Daily  . sodium chloride flush  3 mL Intravenous Q12H    Continuous Infusions: . sodium chloride 125 mL/hr at 06/06/15 1158  . lactated ringers 10 mL/hr at 06/05/15 1717     Time spent: 25mins  Lucien Budney MD, PhD  [facility] Pager [phone]. If 7PM-7AM, please contact night-coverage at [email or web address], password [email or web address] 06/06/2015, 4:42 PM  LOS: 2 days

## 2015-06-06 NOTE — Progress Notes (Signed)
Spoke with patient's daughter in regards to being involved with education about insulin injections and dressing changes. Plan if for patient to be discharged tomorrow, patient will be changing own dressings and self administering insulin. Concerns with educating only patient due to location of wounds and language barriers. Harriett Sineancy states she will be up tomorrow morning and will be around for a while so that education can be completed with patient and daughter.

## 2015-06-07 ENCOUNTER — Inpatient Hospital Stay (HOSPITAL_COMMUNITY): Payer: MEDICAID

## 2015-06-07 DIAGNOSIS — Z720 Tobacco use: Secondary | ICD-10-CM

## 2015-06-07 DIAGNOSIS — N179 Acute kidney failure, unspecified: Secondary | ICD-10-CM

## 2015-06-07 LAB — GLUCOSE, CAPILLARY
GLUCOSE-CAPILLARY: 244 mg/dL — AB (ref 65–99)
Glucose-Capillary: 184 mg/dL — ABNORMAL HIGH (ref 65–99)
Glucose-Capillary: 260 mg/dL — ABNORMAL HIGH (ref 65–99)
Glucose-Capillary: 146 mg/dL — ABNORMAL HIGH (ref 65–99)

## 2015-06-07 LAB — CBC
HCT: 34.8 % — ABNORMAL LOW (ref 36.0–46.0)
Hemoglobin: 11.4 g/dL — ABNORMAL LOW (ref 12.0–15.0)
MCH: 30.1 pg (ref 26.0–34.0)
MCHC: 32.8 g/dL (ref 30.0–36.0)
MCV: 91.8 fL (ref 78.0–100.0)
Platelets: 360 10*3/uL (ref 150–400)
RBC: 3.79 MIL/uL — ABNORMAL LOW (ref 3.87–5.11)
RDW: 12.5 % (ref 11.5–15.5)
WBC: 12 10*3/uL — ABNORMAL HIGH (ref 4.0–10.5)

## 2015-06-07 LAB — BASIC METABOLIC PANEL
ANION GAP: 9 (ref 5–15)
BUN: 19 mg/dL (ref 6–20)
CALCIUM: 7.9 mg/dL — AB (ref 8.9–10.3)
CO2: 22 mmol/L (ref 22–32)
Chloride: 106 mmol/L (ref 101–111)
Creatinine, Ser: 2.57 mg/dL — ABNORMAL HIGH (ref 0.44–1.00)
GFR calc Af Amer: 25 mL/min — ABNORMAL LOW (ref >60)
GFR, EST NON AFRICAN AMERICAN: 22 mL/min — AB (ref >60)
GLUCOSE: 228 mg/dL — AB (ref 65–99)
Potassium: 3.7 mmol/L (ref 3.5–5.1)
SODIUM: 137 mmol/L (ref 135–145)

## 2015-06-07 MED ORDER — ENOXAPARIN SODIUM 30 MG/0.3ML ~~LOC~~ SOLN
30.0000 mg | SUBCUTANEOUS | Status: DC
  Administered 2015-06-07 – 2015-06-09 (×3): 30 mg via SUBCUTANEOUS
  Filled 2015-06-07 (×3): qty 0.3

## 2015-06-07 NOTE — Progress Notes (Signed)
Patient ID: Sara Marshall, female   DOB: 1971-02-05, 45 y.o.   MRN: [id number]     [address] [place], [place] [address]    Phone: [phone] FAX: [phone]     Subjective: No further fevers.  WBC trending down. sCr up to 2.57. Vanc stopped   Objective:  Vital signs:  Filed Vitals:   06/06/15 0609 06/06/15 1238 06/06/15 2228 06/07/15 0539  BP: 119/68 119/74 124/61 115/60  Pulse: 93 86 81 74  Temp: 98.7 F (37.1 C) 99.8 F (37.7 C) 98.9 F (37.2 C) 99 F (37.2 C)  TempSrc: Oral Oral Oral Oral  Resp: _0 Height:      Weight: 74.39 kg (164 lb)   78.427 kg (172 lb 14.4 oz)  SpO2: 99% 98% 97% 98%    Last BM Date: 06/01/15  Intake/Output   Yesterday:  03/23 0701 - 03/24 0700 In: 340 [P.O.:340] Out: 2700 [Urine:2700] This shift:     Physical Exam: General: Pt awake/alert/oriented x4 in no acute distress Skin: upper back wound--purulent drainage, adequately draining, fibrinous tissue at the base. About 2-3cm undermining.  Dressing not adequately packed.    Problem List:   Principal Problem:   Abscess and cellulitis Active Problems:   Diabetes mellitus without complication ([place])   Tobacco abuse   Abscess   Sepsis ([place])   Hyperglycemia    Results:   Labs: Results for orders placed or performed during the hospital encounter of 06/03/15 (from the past 48 hour(s))  MRSA PCR Screening     Status: None   Collection Time: 06/05/15 10:38 AM  Result Value Ref Range   MRSA by PCR NEGATIVE NEGATIVE    Comment:        The GeneXpert MRSA Assay (FDA approved for NASAL specimens only), is one component of a comprehensive MRSA colonization surveillance program. It is not intended to diagnose MRSA infection nor to guide or monitor treatment for MRSA infections.   Glucose, capillary     Status: Abnormal   Collection Time: 06/05/15 11:53 AM  Result Value Ref Range   Glucose-Capillary  245 (H) 65 - 99 mg/dL   Comment 1 Notify RN   Glucose, capillary     Status: Abnormal   Collection Time: 06/05/15  4:44 PM  Result Value Ref Range   Glucose-Capillary 173 (H) 65 - 99 mg/dL  Glucose, capillary     Status: Abnormal   Collection Time: 06/05/15  7:13 PM  Result Value Ref Range   Glucose-Capillary 163 (H) 65 - 99 mg/dL  Culture, routine-abscess     Status: None (Preliminary result)   Collection Time: 06/05/15 10:03 PM  Result Value Ref Range   Specimen Description ABSCESS BACK    Special Requests NONE    Gram Stain      ABUNDANT WBC PRESENT, PREDOMINANTLY PMN NO SQUAMOUS EPITHELIAL CELLS SEEN ABUNDANT GRAM POSITIVE COCCI IN CLUSTERS Performed at [facility]    Culture PENDING    Report Status PENDING   CBC     Status: Abnormal   Collection Time: 06/06/15  3:30 AM  Result Value Ref Range   WBC 13.0 (H) 4.0 - 10.5 K/uL   RBC 3.75 (L) 3.87 - 5.11 MIL/uL   Hemoglobin 11.6 (L) 12.0 - 15.0 g/dL   HCT 34.5 (L) 36.0 - 46.0 %   MCV 92.0 78.0 - 100.0 fL   MCH 30.9 26.0 - 34.0  pg   MCHC 33.6 30.0 - 36.0 g/dL   RDW 12.3 11.5 - 15.5 %   Platelets 336 150 - 400 K/uL  Basic metabolic panel     Status: Abnormal   Collection Time: 06/06/15  3:30 AM  Result Value Ref Range   Sodium 139 135 - 145 mmol/L   Potassium 3.3 (L) 3.5 - 5.1 mmol/L   Chloride 103 101 - 111 mmol/L   CO2 25 22 - 32 mmol/L   Glucose, Bld 294 (H) 65 - 99 mg/dL   BUN 12 6 - 20 mg/dL   Creatinine, Ser 1.19 (H) 0.44 - 1.00 mg/dL   Calcium 7.9 (L) 8.9 - 10.3 mg/dL   GFR calc non Af Amer 55 (L) >60 mL/min   GFR calc Af Amer >60 >60 mL/min    Comment: (NOTE) The eGFR has been calculated using the CKD EPI equation. This calculation has not been validated in all clinical situations. eGFR's persistently <60 mL/min signify possible Chronic Kidney Disease.    Anion gap 11 5 - 15  Magnesium     Status: None   Collection Time: 06/06/15  3:30 AM  Result Value Ref Range   Magnesium 1.7 1.7 - 2.4 mg/dL   Lipid panel     Status: Abnormal   Collection Time: 06/06/15  3:30 AM  Result Value Ref Range   Cholesterol 109 0 - 200 mg/dL   Triglycerides 175 (H) <150 mg/dL   HDL 17 (L) >40 mg/dL   Total CHOL/HDL Ratio 6.4 RATIO   VLDL 35 0 - 40 mg/dL   LDL Cholesterol 57 0 - 99 mg/dL    Comment:        Total Cholesterol/HDL:CHD Risk Coronary Heart Disease Risk Table                     Men   Women  1/2 Average Risk   3.4   3.3  Average Risk       5.0   4.4  2 X Average Risk   9.6   7.1  3 X Average Risk  23.4   11.0        Use the calculated Patient Ratio above and the CHD Risk Table to determine the patient's CHD Risk.        ATP III CLASSIFICATION (LDL):  <100     mg/dL   Optimal  100-129  mg/dL   Near or Above                    Optimal  130-159  mg/dL   Borderline  160-189  mg/dL   High  >190     mg/dL   Very High   Lipid panel     Status: Abnormal   Collection Time: 06/06/15  3:45 AM  Result Value Ref Range   Cholesterol 109 0 - 200 mg/dL   Triglycerides 176 (H) <150 mg/dL   HDL 17 (L) >40 mg/dL   Total CHOL/HDL Ratio 6.4 RATIO   VLDL 35 0 - 40 mg/dL   LDL Cholesterol 57 0 - 99 mg/dL    Comment:        Total Cholesterol/HDL:CHD Risk Coronary Heart Disease Risk Table                     Men   Women  1/2 Average Risk   3.4   3.3  Average Risk       5.0   4.4  2 X Average Risk   9.6   7.1  3 X Average Risk  23.4   11.0        Use the calculated Patient Ratio above and the CHD Risk Table to determine the patient's CHD Risk.        ATP III CLASSIFICATION (LDL):  <100     mg/dL   Optimal  100-129  mg/dL   Near or Above                    Optimal  130-159  mg/dL   Borderline  160-189  mg/dL   High  >190     mg/dL   Very High   TSH     Status: None   Collection Time: 06/06/15  3:50 AM  Result Value Ref Range   TSH 0.425 0.350 - 4.500 uIU/mL  Glucose, capillary     Status: Abnormal   Collection Time: 06/06/15  6:54 AM  Result Value Ref Range   Glucose-Capillary  258 (H) 65 - 99 mg/dL  Glucose, capillary     Status: Abnormal   Collection Time: 06/06/15 11:20 AM  Result Value Ref Range   Glucose-Capillary 342 (H) 65 - 99 mg/dL  Glucose, capillary     Status: Abnormal   Collection Time: 06/06/15  4:18 PM  Result Value Ref Range   Glucose-Capillary 320 (H) 65 - 99 mg/dL  Vancomycin, random     Status: None   Collection Time: 06/06/15  4:57 PM  Result Value Ref Range   Vancomycin Rm 16 ug/mL    Comment:        Random Vancomycin therapeutic range is dependent on dosage and time of specimen collection. A peak range is 20.0-40.0 ug/mL A trough range is 5.0-15.0 ug/mL          Glucose, capillary     Status: Abnormal   Collection Time: 06/06/15  9:44 PM  Result Value Ref Range   Glucose-Capillary 269 (H) 65 - 99 mg/dL   Comment 1 Notify RN    Comment 2 Document in Chart   CBC     Status: Abnormal   Collection Time: 06/07/15  4:30 AM  Result Value Ref Range   WBC 12.0 (H) 4.0 - 10.5 K/uL   RBC 3.79 (L) 3.87 - 5.11 MIL/uL   Hemoglobin 11.4 (L) 12.0 - 15.0 g/dL   HCT 34.8 (L) 36.0 - 46.0 %   MCV 91.8 78.0 - 100.0 fL   MCH 30.1 26.0 - 34.0 pg   MCHC 32.8 30.0 - 36.0 g/dL   RDW 12.5 11.5 - 15.5 %   Platelets 360 150 - 400 K/uL  Basic metabolic panel     Status: Abnormal   Collection Time: 06/07/15  4:30 AM  Result Value Ref Range   Sodium 137 135 - 145 mmol/L   Potassium 3.7 3.5 - 5.1 mmol/L   Chloride 106 101 - 111 mmol/L   CO2 22 22 - 32 mmol/L   Glucose, Bld 228 (H) 65 - 99 mg/dL   BUN 19 6 - 20 mg/dL   Creatinine, Ser 2.57 (H) 0.44 - 1.00 mg/dL    Comment: DELTA CHECK NOTED   Calcium 7.9 (L) 8.9 - 10.3 mg/dL   GFR calc non Af Amer 22 (L) >60 mL/min   GFR calc Af Amer 25 (L) >60 mL/min    Comment: (NOTE) The eGFR has been calculated using the CKD EPI equation. This calculation has not been validated in all  clinical situations. eGFR's persistently <60 mL/min signify possible Chronic Kidney Disease.    Anion gap 9 5 - 15   Glucose, capillary     Status: Abnormal   Collection Time: 06/07/15  6:41 AM  Result Value Ref Range   Glucose-Capillary 184 (H) 65 - 99 mg/dL   Comment 1 Notify RN    Comment 2 Document in Chart     Imaging / Studies: No results found.  Medications / Allergies:  Scheduled Meds: . doxycycline  100 mg Oral Q12H  . enoxaparin (LOVENOX) injection  40 mg Subcutaneous Q24H  . insulin aspart  0-9 Units Subcutaneous TID WC  . insulin aspart  3 Units Subcutaneous TID WC  . insulin glargine  20 Units Subcutaneous QHS  . nicotine  21 mg Transdermal Daily  . sodium chloride flush  3 mL Intravenous Q12H   Continuous Infusions: . sodium chloride 125 mL/hr at 06/06/15 2116  . lactated ringers 10 mL/hr at 06/05/15 1717   PRN Meds:.acetaminophen **OR** acetaminophen, morphine injection, ondansetron, oxyCODONE-acetaminophen  Antibiotics: Anti-infectives    Start     Dose/Rate Route Frequency Ordered Stop   06/06/15 2200  doxycycline (VIBRA-TABS) tablet 100 mg     100 mg Oral Every 12 hours 06/06/15 1633     06/06/15 0000  doxycycline (VIBRA-TABS) 100 MG tablet     100 mg Oral Every 12 hours 06/06/15 1637     06/04/15 0600  vancomycin (VANCOCIN) IVPB 1000 mg/200 mL premix  Status:  Discontinued     1,000 mg 200 mL/hr over 60 Minutes Intravenous Every 12 hours 06/04/15 0553 06/06/15 1229   06/04/15 0200  piperacillin-tazobactam (ZOSYN) IVPB 3.375 g  Status:  Discontinued     3.375 g 12.5 mL/hr over 240 Minutes Intravenous Every 8 hours 06/04/15 0146 06/06/15 1633   06/03/15 2330  vancomycin (VANCOCIN) IVPB 1000 mg/200 mL premix     1,000 mg 200 mL/hr over 60 Minutes Intravenous  Once 06/03/15 2327 06/04/15 0052        Assessment/Plan POD#2 I&D back and abdominal wall abscess---Dr. Georgette Dover -abdominal wall wound is clean, continue BID wet to dry dressing changes. -back wound is not being adequately packed, undermines, fibrinous tissue at the base and purulent drainage. Does not need  further OR debridement, but need PT hydrotherapy and better dressing changes.  Will transfer to 6N, okay with Dr. Erlinda Hong.  ID-GPC on culture, follow.  On Doxycycline  FEN-no issues DM II-on insulin  AKI-sCr worsened.  Management per primary team  VTE prophylaxis-SCD/lovenox  Dispo-6N, PT hydro   Erby Pian, [facility] Surgery Pager [phone]) For consults and floor pages call [phone](7A-4:30P)  06/07/2015

## 2015-06-07 NOTE — Progress Notes (Signed)
Spoke with patient briefly about insulin.  Daughter not present as I was unable to get in touch with her today.  Patient speaks limited English however states she has practiced administering insulin.  Explained that she will need to continue to practice while in the hospital and that daughter should be educated as well.  Patient responded Yes, and states she received "Spanish Diabetes Booklet' while on 3 E.  Reminded RN earlier of patient's need to practice insulin administration. Will also order Spanish videos regarding diabetes education.   Note that patient may need more affordable insulin regimen at discharge such as 70/30 which is cheaper than Lantus.  Thanks, Beryl MeagerJenny Cerina Leary, RN, BC-ADM Inpatient Diabetes Coordinator Pager [phone] (8a-5p)

## 2015-06-07 NOTE — Progress Notes (Signed)
Order placed for DM coordinator to assist with insulin education with pt and daughter, language barrier making education more complex and time consuming. DM coordinator has already been following pt this admit and already assisting with teaching.

## 2015-06-07 NOTE — Progress Notes (Signed)
Patient arrived to floor. [facility] Patient stable and alert.

## 2015-06-07 NOTE — Care Management Note (Signed)
Case Management Note  Patient Details  Name: Sara Marshall MRN: [id number] Date of Birth: 04/21/1970  Subjective/Objective:                    Action/Plan:  UR updated  Expected Discharge Date:                  Expected Discharge Plan:  Home/Self Care  In-House Referral:     Discharge planning Services  [facility], [facility], Medication Assistance  Post Acute Care Choice:    Choice offered to:     DME Arranged:    DME Agency:     HH Arranged:    HH Agency:     Status of Service:  In process, will continue to follow  Medicare Important Message Given:    Date Medicare IM Given:    Medicare IM give by:    Date Additional Medicare IM Given:    Additional Medicare Important Message give by:     If discussed at Long Length of Stay Meetings, dates discussed:    Additional Comments:  Kingsley PlanWile, Ivone Licht Marie, RN 06/07/2015, 2:18 PM

## 2015-06-07 NOTE — Progress Notes (Signed)
approx 1000 this am, DM coordinator Victorino DikeJennifer contacted this RN, she will contact her daughter Harriett Sineancy and set time for her to come today for DM and insulin teaching. This RN spoke with pt's daughter, Harriett Sineancy this am and she is available at any time, she was also informed pt would not be going home today, she needs continued wound care--hydrotherapy to wound on back.

## 2015-06-07 NOTE — Progress Notes (Signed)
Attempted to call patient's daughter x 4.  No answer on daughters phone.  I have also called RN to see if patient's daughter is here.  Note that interpreter was used on 06/05/15 to talk with patient regarding diabetes management.  Patient needs to be administering insulin with each dose in order to practice administration.  It would also be helpful to have daughter practice and be aware of technique.  Will discuss with RN.    Thanks, Beryl MeagerJenny Kerriann Kamphuis, RN, BC-ADM Inpatient Diabetes Coordinator Pager [phone] (8a-5p)

## 2015-06-07 NOTE — Progress Notes (Signed)
PROGRESS NOTE  Sara Marshall VWU:[id number] DOB: Jul 15, 1970 DOA: 06/03/2015 PCP: No PCP Per Patient  HPI/Recap of past 24 hours:  S/p I&D on 3/22, Denies pain, no fever  Now transfer to surgical floor for hydrotherapy due to wound persistent draining  Assessment/Plan: Principal Problem:   Abscess and cellulitis Active Problems:   Diabetes mellitus without complication (HCC)   Tobacco abuse   Abscess   Sepsis (HCC)   Hyperglycemia  Sepsis due to abscess and cellulitis over left upper back and left lower abdomen:  pt is septic with leukocytosis and tachycardia. Lactate level is normal. Hemodynamic stable. The lesion in left upper back looks deep, may need surgical debridement.  - Empiric antimicrobial treatment with vancomycin and Zosyn per pharmacy initially - Blood cultures x 2 , no growth - wound care consult - wound culture was sent out by EDP, + MRSA - monitor Procalcitonin and trend lactic acid levels per sepsis protocol. - IVF: 3.0L of NS bolus in ED, followed by 125 cc/h  - Preg test negative -general surgery input appreciated,   s/p i&d on 3/22, abx adjusted , changed to oral doxycycline on 3/23 per culture and sensitivity result. Started hydrotherapy on 3/24  DM-II/hyperglycemia/elevated anion gap:  -only on metformin at home per care everywhere, per patient has dm2 for 17yrs, has been taking her mother's medicines, no pcp. -A1c 12.7 -Aggressive IV fluid treatment: 3L of normal saline bolus, followed by 125 mL per hour, adjust insulin, diabetic education, care manger consult. -continue adjust insulin  ARF: cr up to 2.57, repeat ua, check renal US, continue ivf, avoid nephrotoxin, vanc and zosyn already discontinued,  repeat bmp in am  Hypokalemia: replace k  Tobacco abuse: -Did counseling about importance of quitting smoking -Nicotine patch  DVT ppx: SCD  Code Status: full  Family Communication: patient   Disposition Plan: pending general surgery  clearance and cr level   Consultants:  General surgery  Procedures:  I&D to back and left flank abscess on 3/22  Antibiotics:  Vanc/ zosyn from admission to 3/23  Doxycycline from 3/23   Objective: BP 126/73 mmHg  Pulse 71  Temp(Src) 98.3 F (36.8 C) (Oral)  Resp 18  Ht  (1.651 m)  Wt 78.427 kg (172 lb 14.4 oz)  BMI 28.77 kg/m2  SpO2 100%  Intake/Output Summary (Last 24 hours) at 06/07/15 1339 Last data filed at 06/07/15 1030  Gross per 24 hour  Intake    120 ml  Output    800 ml  Net   -680 ml   Filed Weights   06/05/15 0621 06/06/15 0609 06/07/15 0539  Weight: 74.345 kg (163 lb 14.4 oz) 74.39 kg (164 lb) 78.427 kg (172 lb 14.4 oz)    Exam:   General:  NAD  Cardiovascular: RRR  Respiratory: CTABL  Abdomen: Soft/ND/NT, positive BS  Musculoskeletal: No Edema  Neuro: aaox3  Skin: upper back and left lower abdominal wall abscesses s/p i&d, dressing intact  Data Reviewed: Basic Metabolic Panel:  Recent Labs Lab 06/03/15 1655 06/04/15 0244 06/06/15 0330 06/07/15 0430  NA 132* 132* 139 137  K 4.1 4.2 3.3* 3.7  CL 93* 99* 103 106  CO2 23 21* 25 22  GLUCOSE 511* 290* 294* 228*  BUN CREATININE 0.55 0.46 1.19* 2.57*  CALCIUM 8.8* 7.8* 7.9* 7.9*  MG  --   --  1.7  --    Liver Function Tests:  Recent Labs Lab 06/04/15 0244  AST 15  ALT 17  ALKPHOS 71  BILITOT 0.4  PROT 5.4*  ALBUMIN 2.1*   No results for input(s): LIPASE, AMYLASE in the last 168 hours. No results for input(s): AMMONIA in the last 168 hours. CBC:  Recent Labs Lab 06/03/15 1655 06/04/15 0244 06/06/15 0330 06/07/15 0430  WBC 15.5* 14.1* 13.0* 12.0*  HGB 13.5 12.2 11.6* 11.4*  HCT 38.9 36.1 34.5* 34.8*  MCV 90.5 90.7 92.0 91.8  PLT 346 319 336 360   Cardiac Enzymes:   No results for input(s): CKTOTAL, CKMB, CKMBINDEX, TROPONINI in the last 168 hours. BNP (last 3 results) No results for input(s): BNP in the last 8760 hours.  ProBNP (last 3  results) No results for input(s): PROBNP in the last 8760 hours.  CBG:  Recent Labs Lab 06/06/15 1120 06/06/15 1618 06/06/15 2144 06/07/15 0641 06/07/15 1218  GLUCAP 342* 320* 269* 184* 260*    Recent Results (from the past 240 hour(s))  Blood culture (routine x 2)     Status: None (Preliminary result)   Collection Time: 06/03/15 11:51 PM  Result Value Ref Range Status   Specimen Description BLOOD RIGHT ARM  Final   Special Requests BOTTLES DRAWN AEROBIC AND ANAEROBIC  Final   Culture NO GROWTH 3 DAYS  Final   Report Status PENDING  Incomplete  Blood culture (routine x 2)     Status: None (Preliminary result)   Collection Time: 06/03/15 11:57 PM  Result Value Ref Range Status   Specimen Description BLOOD LEFT ARM  Final   Special Requests BOTTLES DRAWN AEROBIC AND ANAEROBIC  Final   Culture NO GROWTH 3 DAYS  Final   Report Status PENDING  Incomplete  Wound culture     Status: None   Collection Time: 06/04/15 12:45 AM  Result Value Ref Range Status   Specimen Description ABSCESS  Final   Special Requests BACK  Final   Gram Stain   Final    MODERATE WBC PRESENT, PREDOMINANTLY PMN NO SQUAMOUS EPITHELIAL CELLS SEEN ABUNDANT GRAM POSITIVE COCCI IN CLUSTERS Performed at [facility]    Culture   Final    ABUNDANT METHICILLIN RESISTANT STAPHYLOCOCCUS AUREUS Note: RIFAMPIN AND GENTAMICIN SHOULD NOT BE USED AS SINGLE DRUGS FOR TREATMENT OF STAPH INFECTIONS. This organism DOES NOT demonstrate inducible Clindamycin resistance in vitro. CRITICAL RESULT CALLED TO, READ BACK BY AND VERIFIED WITH: BLAIR Q. AT  9:13AM ON 06/06/2015 [name] Performed at [facility]    Report Status 06/06/2015 FINAL  Final   Organism ID, Bacteria METHICILLIN RESISTANT STAPHYLOCOCCUS AUREUS  Final      Susceptibility   Methicillin resistant staphylococcus aureus - MIC*    CLINDAMYCIN <=0.25 SENSITIVE Sensitive     ERYTHROMYCIN >=8 RESISTANT Resistant     GENTAMICIN <=0.5  SENSITIVE Sensitive     LEVOFLOXACIN 0.25 SENSITIVE Sensitive     OXACILLIN >=4 RESISTANT Resistant     RIFAMPIN <=0.5 SENSITIVE Sensitive     TRIMETH/SULFA <=10 SENSITIVE Sensitive     VANCOMYCIN <=0.5 SENSITIVE Sensitive     TETRACYCLINE <=1 SENSITIVE Sensitive     * ABUNDANT METHICILLIN RESISTANT STAPHYLOCOCCUS AUREUS  MRSA PCR Screening     Status: None   Collection Time: 06/05/15 10:38 AM  Result Value Ref Range Status   MRSA by PCR NEGATIVE NEGATIVE Final    Comment:        The GeneXpert MRSA Assay (FDA approved for NASAL specimens only), is one component of a comprehensive MRSA colonization surveillance program. It  is not intended to diagnose MRSA infection nor to guide or monitor treatment for MRSA infections.   Culture, routine-abscess     Status: None (Preliminary result)   Collection Time: 06/05/15 10:03 PM  Result Value Ref Range Status   Specimen Description ABSCESS BACK  Final   Special Requests NONE  Final   Gram Stain   Final    ABUNDANT WBC PRESENT, PREDOMINANTLY PMN NO SQUAMOUS EPITHELIAL CELLS SEEN ABUNDANT GRAM POSITIVE COCCI IN CLUSTERS [facility]    Culture   Final    ABUNDANT STAPHYLOCOCCUS AUREUS Note: RIFAMPIN AND GENTAMICIN SHOULD NOT BE USED AS SINGLE DRUGS FOR TREATMENT OF STAPH INFECTIONS. Performed at [facility]    Report Status PENDING  Incomplete  Anaerobic culture     Status: None (Preliminary result)   Collection Time: 06/05/15 10:04 PM  Result Value Ref Range Status   Specimen Description ABSCESS BACK  Final   Special Requests NONE  Final   Gram Stain   Final    ABUNDANT WBC PRESENT, PREDOMINANTLY PMN NO SQUAMOUS EPITHELIAL CELLS SEEN ABUNDANT GRAM POSITIVE COCCI IN CLUSTERS [facility]    Culture   Final    NO ANAEROBES ISOLATED; CULTURE IN PROGRESS FOR 5 DAYS [facility] at [facility]    Report Status PENDING  Incomplete     Studies: No results  found.  Scheduled Meds: . doxycycline  100 mg Oral Q12H  . enoxaparin (LOVENOX) injection  30 mg Subcutaneous Q24H  . insulin aspart  0-9 Units Subcutaneous TID WC  . insulin aspart  3 Units Subcutaneous TID WC  . insulin glargine  20 Units Subcutaneous QHS  . nicotine  21 mg Transdermal Daily  . sodium chloride flush  3 mL Intravenous Q12H    Continuous Infusions: . sodium chloride 125 mL/hr at 06/06/15 2116  . lactated ringers 10 mL/hr at 06/05/15 1717     Time spent: 25mins  Latesha Chesney MD, PhD  [facility] Pager [phone]. If 7PM-7AM, please contact night-coverage at [email or web address], password [facility] 06/07/2015, 1:39 PM  LOS: 3 days

## 2015-06-07 NOTE — Progress Notes (Signed)
Physical Therapy Wound Evaluation/Treatment Patient Details  Name: Sara Marshall MRN: [id number] Date of Birth: 05-11-1970  Today's Date: 06/07/2015 Time: 0174-9449 Time Calculation (min): 40 min  Subjective  Subjective: Pt agreeable to hydrotherapy and declines premedicating with pain medicine.  Patient and Family Stated Goals: Pt did not state goals during session Date of Onset:  (2-3 days PTA)  Pain Score: Pt declined premedication for pain; fell asleep during treatment.  Wound Assessment  Wound / Incision (Open or Dehisced) 06/04/15 Other (Comment) Back Upper red/purple discoloration around small open area (Active)  Dressing Type Gauze (Comment);ABD;Moist to dry 06/07/2015  1:24 PM  Dressing Changed Changed 06/07/2015  1:24 PM  Dressing Status Clean;Dry;Intact 06/07/2015  1:24 PM  Dressing Change Frequency Daily 06/07/2015  1:24 PM  Site / Wound Assessment Bleeding;Pink;Red;Yellow;Brown 06/07/2015  1:24 PM  % Wound base Red or Granulating 80% 06/07/2015  1:24 PM  % Wound base Yellow 20% 06/07/2015  1:24 PM  Peri-wound Assessment Erythema (blanchable);Edema;Induration 06/07/2015  1:24 PM  Wound Length (cm) 5.7 cm 06/07/2015  1:24 PM  Wound Width (cm) 4.4 cm 06/07/2015  1:24 PM  Wound Depth (cm) 1.9 cm 06/07/2015  1:24 PM  Undermining (cm) 3.9 at 2 o'clock, 2.8 at 4 o'clock, up to 1.0 everywhere else 06/07/2015  1:24 PM  Margins Unattached edges (unapproximated) 06/07/2015  1:24 PM  Drainage Amount Moderate 06/07/2015  1:24 PM  Drainage Description Purulent 06/07/2015  1:24 PM  Treatment Debridement (Selective);Hydrotherapy (Pulse lavage);Packing (Saline gauze) 06/07/2015  1:24 PM   Hydrotherapy Pulsed lavage therapy - wound location: Upper back Pulsed Lavage with Suction (psi): 8 psi Pulsed Lavage with Suction - Normal Saline Used: 1000 mL Pulsed Lavage Tip: Tip with splash shield Selective Debridement Selective Debridement - Location: Upper back Selective Debridement - Tools Used:  Forceps;Scissors Selective Debridement - Tissue Removed: Yellow and brown necrotic tissue   Wound Assessment and Plan  Wound Therapy - Assess/Plan/Recommendations Wound Therapy - Clinical Statement: Pt presents to hydrotherapy with upper back abscess and cellulitis. This patient will benefit from continued hydrotherapy to promote wound bed healing and decrease bioburden. Wound Therapy - Functional Problem List: Acute pain, decreased tolerance of mobility. Factors Delaying/Impairing Wound Healing: Diabetes Mellitus;Tobacco use Hydrotherapy Plan: Debridement;Dressing change;Patient/family education;Pulsatile lavage with suction Wound Therapy - Frequency: 6X / week Wound Plan: See above  Wound Therapy Goals- Improve the function of patient's integumentary system by progressing the wound(s) through the phases of wound healing (inflammation - proliferation - remodeling) by: Decrease Necrotic Tissue to: 0% Decrease Necrotic Tissue - Progress: Goal set today Increase Granulation Tissue to: 100% Increase Granulation Tissue - Progress: Goal set today Goals/treatment plan/discharge plan were made with and agreed upon by patient/family: Yes Time For Goal Achievement: 7 days Wound Therapy - Potential for Goals: Good  Goals will be updated until maximal potential achieved or discharge criteria met.  Discharge criteria: when goals achieved, discharge from hospital, MD decision/surgical intervention, no progress towards goals, refusal/missing three consecutive treatments without notification or medical reason.  GP     Sara Marshall 06/07/2015, 2:48 PM   Sara Marshall, PT, DPT Acute Rehabilitation Services Pager: [phone]

## 2015-06-07 NOTE — Progress Notes (Signed)
Patient provided teach back and demonstration on how to self administer insulin. Will continue to let patient practice and watch videos.

## 2015-06-07 NOTE — Progress Notes (Signed)
Report completed with RN [place] [address] [place], pt transferring to room 6N22 med surg bed. Pt transferred via wheelchair with belongings.

## 2015-06-08 LAB — URINALYSIS, ROUTINE W REFLEX MICROSCOPIC
BILIRUBIN URINE: NEGATIVE
Glucose, UA: NEGATIVE mg/dL
Ketones, ur: NEGATIVE mg/dL
Leukocytes, UA: NEGATIVE
Nitrite: NEGATIVE
PH: 6.5 (ref 5.0–8.0)
Protein, ur: NEGATIVE mg/dL
Specific Gravity, Urine: 1.004 — ABNORMAL LOW (ref 1.005–1.030)

## 2015-06-08 LAB — GLUCOSE, CAPILLARY
GLUCOSE-CAPILLARY: 178 mg/dL — AB (ref 65–99)
GLUCOSE-CAPILLARY: 228 mg/dL — AB (ref 65–99)
Glucose-Capillary: 128 mg/dL — ABNORMAL HIGH (ref 65–99)
Glucose-Capillary: 320 mg/dL — ABNORMAL HIGH (ref 65–99)

## 2015-06-08 LAB — CBC
HCT: 32.6 % — ABNORMAL LOW (ref 36.0–46.0)
Hemoglobin: 10.6 g/dL — ABNORMAL LOW (ref 12.0–15.0)
MCH: 29.6 pg (ref 26.0–34.0)
MCHC: 32.5 g/dL (ref 30.0–36.0)
MCV: 91.1 fL (ref 78.0–100.0)
PLATELETS: 374 10*3/uL (ref 150–400)
RBC: 3.58 MIL/uL — ABNORMAL LOW (ref 3.87–5.11)
RDW: 12.2 % (ref 11.5–15.5)
WBC: 11.5 10*3/uL — AB (ref 4.0–10.5)

## 2015-06-08 LAB — CULTURE, ROUTINE-ABSCESS

## 2015-06-08 LAB — BASIC METABOLIC PANEL
Anion gap: 9 (ref 5–15)
BUN: 18 mg/dL (ref 6–20)
CALCIUM: 7.8 mg/dL — AB (ref 8.9–10.3)
CO2: 20 mmol/L — ABNORMAL LOW (ref 22–32)
CREATININE: 2.37 mg/dL — AB (ref 0.44–1.00)
Chloride: 111 mmol/L (ref 101–111)
GFR calc Af Amer: 28 mL/min — ABNORMAL LOW (ref >60)
GFR, EST NON AFRICAN AMERICAN: 24 mL/min — AB (ref >60)
GLUCOSE: 171 mg/dL — AB (ref 65–99)
Potassium: 3.7 mmol/L (ref 3.5–5.1)
Sodium: 140 mmol/L (ref 135–145)

## 2015-06-08 LAB — URINE MICROSCOPIC-ADD ON: Bacteria, UA: NONE SEEN

## 2015-06-08 MED ORDER — FLUCONAZOLE 100 MG PO TABS
100.0000 mg | ORAL_TABLET | Freq: Every day | ORAL | Status: DC
  Administered 2015-06-09 – 2015-06-10 (×2): 100 mg via ORAL
  Filled 2015-06-08 (×2): qty 1

## 2015-06-08 MED ORDER — COLLAGENASE 250 UNIT/GM EX OINT
TOPICAL_OINTMENT | Freq: Every day | CUTANEOUS | Status: DC
  Administered 2015-06-09 – 2015-06-10 (×2): via TOPICAL
  Filled 2015-06-08: qty 30

## 2015-06-08 MED ORDER — INFLUENZA VAC SPLIT QUAD 0.5 ML IM SUSY
0.5000 mL | PREFILLED_SYRINGE | INTRAMUSCULAR | Status: AC
  Administered 2015-06-09: 0.5 mL via INTRAMUSCULAR
  Filled 2015-06-08: qty 0.5

## 2015-06-08 MED ORDER — FLUCONAZOLE 100 MG PO TABS
200.0000 mg | ORAL_TABLET | Freq: Once | ORAL | Status: AC
Start: 2015-06-08 — End: 2015-06-08
  Administered 2015-06-08: 200 mg via ORAL
  Filled 2015-06-08: qty 2

## 2015-06-08 NOTE — Progress Notes (Signed)
Egress test performed and patient walks independently and uses a rolling walker. walks in the halls with the walker, walks to BR, sits in chair during awake hours, and performs adls independently.

## 2015-06-08 NOTE — Progress Notes (Signed)
Patients bedtime CBG was 320. M. York notified, no new orders received at this time. Cindee SaltMcBride,Vidit Boissonneault K, RN

## 2015-06-08 NOTE — Progress Notes (Signed)
Sara GellJamie Marshall with [facility] lab confirmed MRSA in wound culture. Patient is on contact precautions already. Hydrotherapy on left back wound is done. Patient has completed watching diabetes videos this morning. I instructed patient on insulin injection at breakfast. No family her at this time.

## 2015-06-08 NOTE — Progress Notes (Signed)
PROGRESS NOTE  Sara Marshall ZOX:[id number] DOB: Dec 15, 1970 DOA: 06/03/2015 PCP: No PCP Per Patient  HPI/Recap of past 24 hours:  S/p I&D on 3/22, Denies pain, no fever  Start getting  hydrotherapy from 3/24  Assessment/Plan: Principal Problem:   Abscess and cellulitis Active Problems:   Diabetes mellitus without complication (HCC)   Tobacco abuse   Abscess   Sepsis (HCC)   Hyperglycemia  Sepsis due to abscess and cellulitis over left upper back and left lower abdomen:  pt is septic with leukocytosis and tachycardia. Lactate level is normal. Hemodynamic stable. The lesion in left upper back looks deep, may need surgical debridement.  - Empiric antimicrobial treatment with vancomycin and Zosyn per pharmacy initially - Blood cultures x 2 , no growth, wound culture was sent out by EDP, + MRSA, Preg test negative -s/p i&d on 3/22, abx adjusted , changed to oral doxycycline on 3/23 per culture and sensitivity result. Started hydrotherapy on 3/24, general surgery input appreciated  DM-II/hyperglycemia/elevated anion gap initially on presentation:  -only on metformin at home per care everywhere, per patient has dm2 for 14yrs, has been taking her mother's medicines, no pcp. -A1c 12.7 -Aggressive IV fluid treatment: 3L of normal saline bolus, followed by 125 mL per hour, adjust insulin, diabetic education, care manger consult. -continue adjust insulin  ARF: cr up to 2.57,  renal US no hydronephrosis, repeat ua + yeast, will treat with diflucan due to poorly controlled dm2 and acute increase of cr.  continue ivf, avoid nephrotoxin,  repeat bmp in am  Hypokalemia: replace k  Tobacco abuse: -Did counseling about importance of quitting smoking -Nicotine patch  DVT ppx: SCD  Code Status: full  Family Communication: patient   Disposition Plan: pending general surgery clearance and cr level   Consultants:  General surgery  Procedures:  I&D to back and left flank abscess on  3/22  Hydrotherapy from 3/24  Antibiotics:  Vanc/ zosyn from admission to 3/23  Doxycycline from 3/23   Objective: BP 109/73 mmHg  Pulse 71  Temp(Src) 98.2 F (36.8 C) (Oral)  Resp 18  Ht 5\' 5"  (1.651 m)  Wt 78.427 kg (172 lb 14.4 oz)  BMI 28.77 kg/m2  SpO2 99%  Intake/Output Summary (Last 24 hours) at 06/08/15 1057 Last data filed at 06/08/15 0947  Gross per 24 hour  Intake    603 ml  Output    600 ml  Net      3 ml   Filed Weights   06/05/15 0621 06/06/15 0609 06/07/15 0539  Weight: 74.345 kg (163 lb 14.4 oz) 74.39 kg (164 lb) 78.427 kg (172 lb 14.4 oz)    Exam:   General:  NAD  Cardiovascular: RRR  Respiratory: CTABL  Abdomen: Soft/ND/NT, positive BS  Musculoskeletal: No Edema  Neuro: aaox3  Skin: upper back and left lower abdominal wall abscesses s/p i&d, dressing intact  Data Reviewed: Basic Metabolic Panel:  Recent Labs Lab 06/03/15 1655 06/04/15 0244 06/06/15 0330 06/07/15 0430 06/08/15 0421  NA 132* 132* 139 137 140  K 4.1 4.2 3.3* 3.7 3.7  CL 93* 99* 103 106 111  CO2 23 21* 25 22 20*  GLUCOSE 511* 290* 294* 228* 171*  BUN 11 8 12 19 18   CREATININE 0.55 0.46 1.19* 2.57* 2.37*  CALCIUM 8.8* 7.8* 7.9* 7.9* 7.8*  MG  --   --  1.7  --   --    Liver Function Tests:  Recent Labs Lab 06/04/15 0244  AST 15  ALT 17  ALKPHOS 71  BILITOT 0.4  PROT 5.4*  ALBUMIN 2.1*   No results for input(s): LIPASE, AMYLASE in the last 168 hours. No results for input(s): AMMONIA in the last 168 hours. CBC:  Recent Labs Lab 06/03/15 1655 06/04/15 0244 06/06/15 0330 06/07/15 0430 06/08/15 0421  WBC 15.5* 14.1* 13.0* 12.0* 11.5*  HGB 13.5 12.2 11.6* 11.4* 10.6*  HCT 38.9 36.1 34.5* 34.8* 32.6*  MCV 90.5 90.7 92.0 91.8 91.1  PLT 346 319 336 360 374   Cardiac Enzymes:   No results for input(s): CKTOTAL, CKMB, CKMBINDEX, TROPONINI in the last 168 hours. BNP (last 3 results) No results for input(s): BNP in the last 8760 hours.  ProBNP  (last 3 results) No results for input(s): PROBNP in the last 8760 hours.  CBG:  Recent Labs Lab 06/07/15 0641 06/07/15 1218 06/07/15 1750 06/07/15 2215 06/08/15 0722  GLUCAP 184* 260* 146* 244* 128*    Recent Results (from the past 240 hour(s))  Blood culture (routine x 2)     Status: None (Preliminary result)   Collection Time: 06/03/15 11:51 PM  Result Value Ref Range Status   Specimen Description BLOOD RIGHT ARM  Final   Special Requests BOTTLES DRAWN AEROBIC AND ANAEROBIC  Final   Culture NO GROWTH 4 DAYS  Final   Report Status PENDING  Incomplete  Blood culture (routine x 2)     Status: None (Preliminary result)   Collection Time: 06/03/15 11:57 PM  Result Value Ref Range Status   Specimen Description BLOOD LEFT ARM  Final   Special Requests BOTTLES DRAWN AEROBIC AND ANAEROBIC  Final   Culture NO GROWTH 4 DAYS  Final   Report Status PENDING  Incomplete  Wound culture     Status: None   Collection Time: 06/04/15 12:45 AM  Result Value Ref Range Status   Specimen Description ABSCESS  Final   Special Requests BACK  Final   Gram Stain   Final    MODERATE WBC PRESENT, PREDOMINANTLY PMN NO SQUAMOUS EPITHELIAL CELLS SEEN ABUNDANT GRAM POSITIVE COCCI IN CLUSTERS Performed at [facility]    Culture   Final    ABUNDANT METHICILLIN RESISTANT STAPHYLOCOCCUS AUREUS Note: RIFAMPIN AND GENTAMICIN SHOULD NOT BE USED AS SINGLE DRUGS FOR TREATMENT OF STAPH INFECTIONS. This organism DOES NOT demonstrate inducible Clindamycin resistance in vitro. CRITICAL RESULT CALLED TO, READ BACK BY AND VERIFIED WITH: BLAIR Q. AT  9:13AM ON 06/06/2015 [name] Performed at [facility]    Report Status 06/06/2015 FINAL  Final   Organism ID, Bacteria METHICILLIN RESISTANT STAPHYLOCOCCUS AUREUS  Final      Susceptibility   Methicillin resistant staphylococcus aureus - MIC*    CLINDAMYCIN <=0.25 SENSITIVE Sensitive     ERYTHROMYCIN >=8 RESISTANT Resistant     GENTAMICIN  <=0.5 SENSITIVE Sensitive     LEVOFLOXACIN 0.25 SENSITIVE Sensitive     OXACILLIN >=4 RESISTANT Resistant     RIFAMPIN <=0.5 SENSITIVE Sensitive     TRIMETH/SULFA <=10 SENSITIVE Sensitive     VANCOMYCIN <=0.5 SENSITIVE Sensitive     TETRACYCLINE <=1 SENSITIVE Sensitive     * ABUNDANT METHICILLIN RESISTANT STAPHYLOCOCCUS AUREUS  MRSA PCR Screening     Status: None   Collection Time: 06/05/15 10:38 AM  Result Value Ref Range Status   MRSA by PCR NEGATIVE NEGATIVE Final    Comment:        The GeneXpert MRSA Assay (FDA approved for NASAL specimens only), is one component of  a comprehensive MRSA colonization surveillance program. It is not intended to diagnose MRSA infection nor to guide or monitor treatment for MRSA infections.   Culture, routine-abscess     Status: None   Collection Time: 06/05/15 10:03 PM  Result Value Ref Range Status   Specimen Description ABSCESS BACK  Final   Special Requests NONE  Final   Gram Stain   Final    ABUNDANT WBC PRESENT, PREDOMINANTLY PMN NO SQUAMOUS EPITHELIAL CELLS SEEN ABUNDANT GRAM POSITIVE COCCI IN CLUSTERS Performed at [facility]    Culture   Final    ABUNDANT METHICILLIN RESISTANT STAPHYLOCOCCUS AUREUS Note: RIFAMPIN AND GENTAMICIN SHOULD NOT BE USED AS SINGLE DRUGS FOR TREATMENT OF STAPH INFECTIONS. This organism DOES NOT demonstrate inducible Clindamycin resistance in vitro. CRITICAL RESULT CALLED TO, READ BACK BY AND VERIFIED WITH: BRENDA H. AT  9:14AM ON 06/08/2015 [name] Performed at [facility]    Report Status 06/08/2015 FINAL  Final   Organism ID, Bacteria METHICILLIN RESISTANT STAPHYLOCOCCUS AUREUS  Final      Susceptibility   Methicillin resistant staphylococcus aureus - MIC*    CLINDAMYCIN <=0.25 SENSITIVE Sensitive     ERYTHROMYCIN >=8 RESISTANT Resistant     GENTAMICIN <=0.5 SENSITIVE Sensitive     LEVOFLOXACIN 0.25 SENSITIVE Sensitive     OXACILLIN >=4 RESISTANT Resistant     RIFAMPIN <=0.5  SENSITIVE Sensitive     TRIMETH/SULFA <=10 SENSITIVE Sensitive     VANCOMYCIN <=0.5 SENSITIVE Sensitive     TETRACYCLINE <=1 SENSITIVE Sensitive     * ABUNDANT METHICILLIN RESISTANT STAPHYLOCOCCUS AUREUS  Anaerobic culture     Status: None (Preliminary result)   Collection Time: 06/05/15 10:04 PM  Result Value Ref Range Status   Specimen Description ABSCESS BACK  Final   Special Requests NONE  Final   Gram Stain   Final    ABUNDANT WBC PRESENT, PREDOMINANTLY PMN NO SQUAMOUS EPITHELIAL CELLS SEEN ABUNDANT GRAM POSITIVE COCCI IN CLUSTERS Performed at [facility]    Culture   Final    NO ANAEROBES ISOLATED; CULTURE IN PROGRESS FOR 5 DAYS Performed at [facility]    Report Status PENDING  Incomplete     Studies: US Renal  06/07/2015  CLINICAL DATA:  Elevated creatinine. EXAM: RENAL / URINARY TRACT ULTRASOUND COMPLETE COMPARISON:  None. FINDINGS: Right Kidney: Length: 12.9 cm. Mildly increased parenchymal echogenicity. Trace perinephric fluid adjacent to the upper goal. No mass or hydronephrosis visualized. Left Kidney: Length: 13.6 cm. Minimally increased parenchymal echogenicity. No mass or hydronephrosis visualized. Bladder: Appears normal for degree of bladder distention. IMPRESSION: Mildly echogenic kidneys consistent with medical renal disease. No hydronephrosis. Electronically Signed   By: Sebastian Ache M.D.   On: 06/07/2015 16:54    Scheduled Meds: . doxycycline  100 mg Oral Q12H  . enoxaparin (LOVENOX) injection  30 mg Subcutaneous Q24H  . [START ON 06/09/2015] fluconazole  100 mg Oral Daily  . fluconazole  200 mg Oral Once  . insulin aspart  0-9 Units Subcutaneous TID WC  . insulin aspart  3 Units Subcutaneous TID WC  . insulin glargine  20 Units Subcutaneous QHS  . nicotine  21 mg Transdermal Daily  . sodium chloride flush  3 mL Intravenous Q12H    Continuous Infusions: . sodium chloride 125 mL/hr at 06/08/15 0626  . lactated ringers 10 mL/hr at  06/05/15 1717     Time spent:  Anela Bensman MD, PhD  [facility] Pager [phone]. If 7PM-7AM, please contact  night-coverage at [email or web address], password [email or web address] 06/08/2015, 10:57 AM  LOS: 4 days

## 2015-06-08 NOTE — Progress Notes (Signed)
3 Days Post-Op  Subjective:  Stable and alert. Receiving wet-to-dry dressings to abdominal wound twice a day and this reportedly looks good. I examined the back wound with physical therapy who are here for hydrotherapy.  A little bit of purulence but cleaning up nicely and no further debridement necessary Cultures growing staph aureus, sensitivities pending Agree with doxycycline  Blood sugars range from 128 - 260.  Still a little difficult to control.  Management per internal medicine. WBC 11,500.  Objective: Vital signs in last 24 hours: Temp:  [98.2 F (36.8 C)-98.3 F (36.8 C)] 98.2 F (36.8 C) (03/25 0514) Pulse Rate:  [71] 71 (03/25 0514) Resp:  [18] 18 (03/25 0514) BP: (109-126)/(73) 109/73 mmHg (03/25 0514) SpO2:  [99 %-100 %] 99 % (03/25 0514) Last BM Date: 06/05/15  Intake/Output from previous day: 03/24 0701 - 03/25 0700 In: 360 [P.O.:360] Out: -  Intake/Output this shift:    General appearance: Alert.  Friendly.  Cooperative.  No distress. Incision/Wound:  Upper back wound starting to granulate.  Still some.  Once but no further necrotic soft tissue or skin.  No debridement necessary.  A little undermining medially explored digitally.  Abdominal wound to be readdressed by nursing staff.  Apparently this is looking good.  Lab Results:  Results for orders placed or performed during the hospital encounter of 06/03/15 (from the past 24 hour(s))  Glucose, capillary     Status: Abnormal   Collection Time: 06/07/15 12:18 PM  Result Value Ref Range   Glucose-Capillary 260 (H) 65 - 99 mg/dL  Glucose, capillary     Status: Abnormal   Collection Time: 06/07/15  5:50 PM  Result Value Ref Range   Glucose-Capillary 146 (H) 65 - 99 mg/dL   Comment 1 Notify RN   Glucose, capillary     Status: Abnormal   Collection Time: 06/07/15 10:15 PM  Result Value Ref Range   Glucose-Capillary 244 (H) 65 - 99 mg/dL  CBC     Status: Abnormal   Collection Time: 06/08/15  4:21 AM   Result Value Ref Range   WBC 11.5 (H) 4.0 - 10.5 K/uL   RBC 3.58 (L) 3.87 - 5.11 MIL/uL   Hemoglobin 10.6 (L) 12.0 - 15.0 g/dL   HCT 16.1 (L) 09.6 - 04.5 %   MCV 91.1 78.0 - 100.0 fL   MCH 29.6 26.0 - 34.0 pg   MCHC 32.5 30.0 - 36.0 g/dL   RDW 40.9 81.1 - 91.4 %   Platelets 374 150 - 400 K/uL  Basic metabolic panel     Status: Abnormal   Collection Time: 06/08/15  4:21 AM  Result Value Ref Range   Sodium 140 135 - 145 mmol/L   Potassium 3.7 3.5 - 5.1 mmol/L   Chloride 111 101 - 111 mmol/L   CO2 20 (L) 22 - 32 mmol/L   Glucose, Bld 171 (H) 65 - 99 mg/dL   BUN 18 6 - 20 mg/dL   Creatinine, Ser 7.82 (H) 0.44 - 1.00 mg/dL   Calcium 7.8 (L) 8.9 - 10.3 mg/dL   GFR calc non Af Amer 24 (L) >60 mL/min   GFR calc Af Amer 28 (L) >60 mL/min   Anion gap 9 5 - 15  Glucose, capillary     Status: Abnormal   Collection Time: 06/08/15  7:22 AM  Result Value Ref Range   Glucose-Capillary 128 (H) 65 - 99 mg/dL     Studies/Results: US Renal  06/07/2015  CLINICAL DATA:  Elevated  creatinine. EXAM: RENAL / URINARY TRACT ULTRASOUND COMPLETE COMPARISON:  None. FINDINGS: Right Kidney: Length: 12.9 cm. Mildly increased parenchymal echogenicity. Trace perinephric fluid adjacent to the upper goal. No mass or hydronephrosis visualized. Left Kidney: Length: 13.6 cm. Minimally increased parenchymal echogenicity. No mass or hydronephrosis visualized. Bladder: Appears normal for degree of bladder distention. IMPRESSION: Mildly echogenic kidneys consistent with medical renal disease. No hydronephrosis. Electronically Signed   By: Sebastian AcheAllen  Grady Marshall.D.   On: 06/07/2015 16:54    . doxycycline  100 mg Oral Q12H  . enoxaparin (LOVENOX) injection  30 mg Subcutaneous Q24H  . insulin aspart  0-9 Units Subcutaneous TID WC  . insulin aspart  3 Units Subcutaneous TID WC  . insulin glargine  20 Units Subcutaneous QHS  . nicotine  21 mg Transdermal Daily  . sodium chloride flush  3 mL Intravenous Q12H      Assessment/Plan: s/p Procedure(s): INCISION AND DRAINAGE ABSCESS/ABDOMEN AND BACK  POD#3 I&D back and abdominal wall abscess---Dr. Corliss Skainssuei -abdominal wall wound is clean, continue BID wet to dry dressing changes. -back wound is not being adequately packed, undermines, fibrinous tissue at the base and purulent drainage. Does not need further OR debridement, but need PT hydrotherapy and better dressing changes.  -I think we have source control on her sepsis -Not ready for outpatient management yet.   ID-staff aureus on culture.  Sensitivities pending. On Doxycycline --this should be adequate FEN-no issues DM II-on insulin .  Still working on better control. AKI-sCr worsened... 2.57 yesterday, 2.37 today.. Management per primary team  VTE prophylaxis-SCD/lovenox  Dispo-6N, PT hydro   @PROBHOSP @  LOS: 4 days    Sara Marshall 06/08/2015  . .prob

## 2015-06-08 NOTE — Progress Notes (Signed)
Physical Therapy Wound Treatment Patient Details  Name: Sara Marshall MRN: [id number] Date of Birth: Aug 06, 1970  Today's Date: 06/08/2015 Time: 0820-0849 Time Calculation (min): 29 min  Subjective  Subjective: Pt agreeable to hydrotherapy and declines premedicating with pain medicine.  Patient and Family Stated Goals: Wounds to heal.  Pain Score:  Denied pain  Wound Assessment  Wound / Incision (Open or Dehisced) 06/04/15 Other (Comment) Back Upper red/purple discoloration around small open area (Active)  Dressing Type Gauze (Comment);ABD;Moist to dry;Barrier Film (skin prep) 06/08/2015  2:00 PM  Dressing Changed Changed 06/08/2015  2:00 PM  Dressing Status Clean;Dry;Intact 06/08/2015  2:00 PM  Dressing Change Frequency Daily 06/08/2015  2:00 PM  Site / Wound Assessment Bleeding;Pink;Red;Yellow;Brown 06/08/2015  2:00 PM  % Wound base Red or Granulating 80% 06/08/2015  2:00 PM  % Wound base Yellow 20% 06/08/2015  2:00 PM  Peri-wound Assessment Erythema (blanchable);Edema;Induration 06/08/2015  2:00 PM  Wound Length (cm) 5.7 cm 06/07/2015  1:24 PM  Wound Width (cm) 4.4 cm 06/07/2015  1:24 PM  Wound Depth (cm) 1.9 cm 06/07/2015  1:24 PM  Undermining (cm) 3.9 at 2 o'clock, 2.8 at 4 o'clock, up to 1.0 everywhere else 06/07/2015  1:24 PM  Margins Unattached edges (unapproximated) 06/08/2015  2:00 PM  Closure None 06/08/2015  2:00 PM  Drainage Amount Moderate 06/08/2015  2:00 PM  Drainage Description Purulent 06/08/2015  2:00 PM  Non-staged Wound Description Full thickness 06/08/2015  2:00 PM  Treatment Debridement (Selective);Hydrotherapy (Pulse lavage);Packing (Saline gauze) 06/08/2015  2:00 PM     Incision (Closed) 06/05/15 Back Other (Comment) (Active)  Dressing Type ABD 06/07/2015  8:00 PM  Dressing Clean;Dry;Intact 06/07/2015  8:00 PM  Site / Wound Assessment Dressing in place / Unable to assess 06/07/2015  8:00 PM  Drainage Amount Minimal 06/06/2015  3:07 PM  Drainage Description Serosanguineous  06/06/2015  3:07 PM  Treatment Cleansed 06/06/2015  3:07 PM     Incision (Closed) 06/05/15 Abdomen Other (Comment) (Active)  Dressing Type Moist to dry;Gauze (Comment) 06/08/2015  8:00 AM  Dressing Clean;Dry;Intact 06/08/2015  8:00 AM  Dressing Change Frequency Twice a day 06/08/2015  8:00 AM  Site / Wound Assessment Dressing in place / Unable to assess 06/08/2015  8:00 AM  Drainage Amount None 06/07/2015  8:00 PM  Drainage Description Serosanguineous 06/06/2015  3:07 PM  Treatment Cleansed 06/08/2015  5:00 AM   Hydrotherapy Pulsed lavage therapy - wound location: Upper back Pulsed Lavage with Suction (psi): 12 psi Pulsed Lavage with Suction - Normal Saline Used: 1000 mL Pulsed Lavage Tip: Tip with splash shield Selective Debridement Selective Debridement - Location: Upper back Selective Debridement - Tools Used: Forceps;Scissors Selective Debridement - Tissue Removed: Yellow necrotic tissue   Wound Assessment and Plan  Wound Therapy - Assess/Plan/Recommendations Wound Therapy - Clinical Statement: Pt presents to hydrotherapy with upper back abscess and cellulitis. This patient will benefit from continued hydrotherapy to promote wound bed healing and decrease bioburden. Wound Therapy - Functional Problem List: Acute pain, decreased tolerance of mobility. Factors Delaying/Impairing Wound Healing: Diabetes Mellitus;Tobacco use Hydrotherapy Plan: Debridement;Dressing change;Patient/family education;Pulsatile lavage with suction Wound Therapy - Frequency: 6X / week Wound Therapy - Follow Up Recommendations: Home health RN Wound Plan: See above  Wound Therapy Goals- Improve the function of patient's integumentary system by progressing the wound(s) through the phases of wound healing (inflammation - proliferation - remodeling) by: Decrease Necrotic Tissue to: 0% Decrease Necrotic Tissue - Progress: Progressing toward goal Increase Granulation Tissue to: 100% Increase Granulation Tissue -  Progress: Progressing toward goal  Goals will be updated until maximal potential achieved or discharge criteria met.  Discharge criteria: when goals achieved, discharge from hospital, MD decision/surgical intervention, no progress towards goals, refusal/missing three consecutive treatments without notification or medical reason.  GP     [name], [place], [place] 06/08/2015, 2:36 PM

## 2015-06-08 NOTE — Progress Notes (Signed)
Patient demonstrated proper technique of insulin administration with supper meal.

## 2015-06-09 LAB — BASIC METABOLIC PANEL
ANION GAP: 7 (ref 5–15)
BUN: 19 mg/dL (ref 6–20)
CALCIUM: 8.1 mg/dL — AB (ref 8.9–10.3)
CHLORIDE: 112 mmol/L — AB (ref 101–111)
CO2: 21 mmol/L — ABNORMAL LOW (ref 22–32)
CREATININE: 2.28 mg/dL — AB (ref 0.44–1.00)
GFR calc non Af Amer: 25 mL/min — ABNORMAL LOW (ref >60)
GFR, EST AFRICAN AMERICAN: 29 mL/min — AB (ref >60)
Glucose, Bld: 169 mg/dL — ABNORMAL HIGH (ref 65–99)
Potassium: 4 mmol/L (ref 3.5–5.1)
SODIUM: 140 mmol/L (ref 135–145)

## 2015-06-09 LAB — GLUCOSE, CAPILLARY
GLUCOSE-CAPILLARY: 139 mg/dL — AB (ref 65–99)
GLUCOSE-CAPILLARY: 214 mg/dL — AB (ref 65–99)
Glucose-Capillary: 189 mg/dL — ABNORMAL HIGH (ref 65–99)
Glucose-Capillary: 116 mg/dL — ABNORMAL HIGH (ref 65–99)

## 2015-06-09 LAB — CBC
HCT: 33.5 % — ABNORMAL LOW (ref 36.0–46.0)
HEMOGLOBIN: 10.8 g/dL — AB (ref 12.0–15.0)
MCH: 29.7 pg (ref 26.0–34.0)
MCHC: 32.2 g/dL (ref 30.0–36.0)
MCV: 92 fL (ref 78.0–100.0)
Platelets: 418 10*3/uL — ABNORMAL HIGH (ref 150–400)
RBC: 3.64 MIL/uL — ABNORMAL LOW (ref 3.87–5.11)
RDW: 12.4 % (ref 11.5–15.5)
WBC: 10.7 10*3/uL — AB (ref 4.0–10.5)

## 2015-06-09 LAB — CULTURE, BLOOD (ROUTINE X 2)
CULTURE: NO GROWTH
CULTURE: NO GROWTH

## 2015-06-09 MED ORDER — LACTATED RINGERS IV SOLN
INTRAVENOUS | Status: DC
  Administered 2015-06-09: 18:00:00 via INTRAVENOUS

## 2015-06-09 NOTE — Progress Notes (Signed)
PROGRESS NOTE  Sara Marshall ZOX:[id number] DOB: 30-Jun-1970 DOA: 06/03/2015 PCP: No PCP Per Patient  HPI/Recap of past 24 hours:  S/p I&D on 3/22, Denies pain, no fever  Start getting  hydrotherapy from 3/24 Cr better, am blood sugar better Sitting in chair, ask when she can go home  Assessment/Plan: Principal Problem:   Abscess and cellulitis Active Problems:   Diabetes mellitus without complication (HCC)   Tobacco abuse   Abscess   Sepsis (HCC)   Hyperglycemia  Sepsis due to abscess and cellulitis over left upper back and left lower abdomen:  pt is septic with leukocytosis and tachycardia. Lactate level is normal. Hemodynamic stable. The lesion in left upper back looks deep, may need surgical debridement.  - Empiric antimicrobial treatment with vancomycin and Zosyn per pharmacy initially - Blood cultures x 2 , no growth, wound culture was sent out by EDP, + MRSA, Preg test negative -s/p i&d on 3/22, abx adjusted , changed to oral doxycycline on 3/23 per culture and sensitivity result. Started hydrotherapy on 3/24, general surgery input appreciated  DM-II/hyperglycemia/elevated anion gap initially on presentation:  -only on metformin at home per care everywhere, per patient has dm2 for 66yrs, has been taking her mother's medicines, no pcp. -A1c 12.7 -Aggressive IV fluid treatment: 3L of normal saline bolus, followed by 125 mL per hour, adjust insulin, diabetic education, care manger consult. -continue adjust insulin  ARF: cr up to 2.57,  renal US no hydronephrosis, repeat ua + yeast, will treat with diflucan due to poorly controlled dm2 and acute increase of cr.  continue ivf, avoid nephrotoxin,  Cr slowing improving  Hypokalemia: replace k  Tobacco abuse: -Did counseling about importance of quitting smoking -Nicotine patch  DVT ppx: SCD  Code Status: full  Family Communication: patient   Disposition Plan: pending general surgery clearance and cr  level   Consultants:  General surgery  Procedures:  I&D to back and left flank abscess on 3/22  Hydrotherapy from Monday to saturday  Antibiotics:  Vanc/ zosyn from admission to 3/23  Doxycycline from 3/23   Objective: BP 143/79 mmHg  Pulse 74  Temp(Src) 99 F (37.2 C) (Oral)  Resp 16  Ht  (1.651 m)  Wt 78.427 kg (172 lb 14.4 oz)  BMI 28.77 kg/m2  SpO2 98%  Intake/Output Summary (Last 24 hours) at 06/09/15 1037 Last data filed at 06/09/15 0900  Gross per 24 hour  Intake 8406.25 ml  Output   2350 ml  Net 6056.25 ml   Filed Weights   06/05/15 0621 06/06/15 0609 06/07/15 0539  Weight: 74.345 kg (163 lb 14.4 oz) 74.39 kg (164 lb) 78.427 kg (172 lb 14.4 oz)    Exam:   General:  NAD  Cardiovascular: RRR  Respiratory: CTABL  Abdomen: Soft/ND/NT, positive BS  Musculoskeletal: No Edema  Neuro: aaox3  Skin: upper back and left lower abdominal wall abscesses s/p i&d, dressing intact  Data Reviewed: Basic Metabolic Panel:  Recent Labs Lab 06/04/15 0244 06/06/15 0330 06/07/15 0430 06/08/15 0421 06/09/15 0645  NA 132* 139 137 140 140  K 4.2 3.3* 3.7 3.7 4.0  CL 99* 103 106 111 112*  CO2 21* 25 22 20* 21*  GLUCOSE 290* 294* 228* 171* 169*  BUN CREATININE 0.46 1.19* 2.57* 2.37* 2.28*  CALCIUM 7.8* 7.9* 7.9* 7.8* 8.1*  MG  --  1.7  --   --   --    Liver Function Tests:  Recent Labs  Lab 06/04/15 0244  AST 15  ALT 17  ALKPHOS 71  BILITOT 0.4  PROT 5.4*  ALBUMIN 2.1*   No results for input(s): LIPASE, AMYLASE in the last 168 hours. No results for input(s): AMMONIA in the last 168 hours. CBC:  Recent Labs Lab 06/04/15 0244 06/06/15 0330 06/07/15 0430 06/08/15 0421 06/09/15 0645  WBC 14.1* 13.0* 12.0* 11.5* 10.7*  HGB 12.2 11.6* 11.4* 10.6* 10.8*  HCT 36.1 34.5* 34.8* 32.6* 33.5*  MCV 90.7 92.0 91.8 91.1 92.0  PLT 319 336 360 374 418*   Cardiac Enzymes:   No results for input(s): CKTOTAL, CKMB, CKMBINDEX,  TROPONINI in the last 168 hours. BNP (last 3 results) No results for input(s): BNP in the last 8760 hours.  ProBNP (last 3 results) No results for input(s): PROBNP in the last 8760 hours.  CBG:  Recent Labs Lab 06/08/15 0722 06/08/15 1141 06/08/15 1724 06/08/15 2219 06/09/15 0715  GLUCAP 128* 178* 228* 320* 139*    Recent Results (from the past 240 hour(s))  Blood culture (routine x 2)     Status: None (Preliminary result)   Collection Time: 06/03/15 11:51 PM  Result Value Ref Range Status   Specimen Description BLOOD RIGHT ARM  Final   Special Requests BOTTLES DRAWN AEROBIC AND ANAEROBIC  Final   Culture NO GROWTH 4 DAYS  Final   Report Status PENDING  Incomplete  Blood culture (routine x 2)     Status: None (Preliminary result)   Collection Time: 06/03/15 11:57 PM  Result Value Ref Range Status   Specimen Description BLOOD LEFT ARM  Final   Special Requests BOTTLES DRAWN AEROBIC AND ANAEROBIC  Final   Culture NO GROWTH 4 DAYS  Final   Report Status PENDING  Incomplete  Wound culture     Status: None   Collection Time: 06/04/15 12:45 AM  Result Value Ref Range Status   Specimen Description ABSCESS  Final   Special Requests BACK  Final   Gram Stain   Final    MODERATE WBC PRESENT, PREDOMINANTLY PMN NO SQUAMOUS EPITHELIAL CELLS SEEN ABUNDANT GRAM POSITIVE COCCI IN CLUSTERS Performed at [facility]    Culture   Final    ABUNDANT METHICILLIN RESISTANT STAPHYLOCOCCUS AUREUS Note: RIFAMPIN AND GENTAMICIN SHOULD NOT BE USED AS SINGLE DRUGS FOR TREATMENT OF STAPH INFECTIONS. This organism DOES NOT demonstrate inducible Clindamycin resistance in vitro. CRITICAL RESULT CALLED TO, READ BACK BY AND VERIFIED WITH: BLAIR Q. AT  9:13AM ON 06/06/2015 [name] Performed at [facility]    Report Status 06/06/2015 FINAL  Final   Organism ID, Bacteria METHICILLIN RESISTANT STAPHYLOCOCCUS AUREUS  Final      Susceptibility   Methicillin resistant staphylococcus  aureus - MIC*    CLINDAMYCIN <=0.25 SENSITIVE Sensitive     ERYTHROMYCIN >=8 RESISTANT Resistant     GENTAMICIN <=0.5 SENSITIVE Sensitive     LEVOFLOXACIN 0.25 SENSITIVE Sensitive     OXACILLIN >=4 RESISTANT Resistant     RIFAMPIN <=0.5 SENSITIVE Sensitive     TRIMETH/SULFA <=10 SENSITIVE Sensitive     VANCOMYCIN <=0.5 SENSITIVE Sensitive     TETRACYCLINE <=1 SENSITIVE Sensitive     * ABUNDANT METHICILLIN RESISTANT STAPHYLOCOCCUS AUREUS  MRSA PCR Screening     Status: None   Collection Time: 06/05/15 10:38 AM  Result Value Ref Range Status   MRSA by PCR NEGATIVE NEGATIVE Final    Comment:        The GeneXpert MRSA Assay (FDA approved for  NASAL specimens only), is one component of a comprehensive MRSA colonization surveillance program. It is not intended to diagnose MRSA infection nor to guide or monitor treatment for MRSA infections.   Culture, routine-abscess     Status: None   Collection Time: 06/05/15 10:03 PM  Result Value Ref Range Status   Specimen Description ABSCESS BACK  Final   Special Requests NONE  Final   Gram Stain   Final    ABUNDANT WBC PRESENT, PREDOMINANTLY PMN NO SQUAMOUS EPITHELIAL CELLS SEEN ABUNDANT GRAM POSITIVE COCCI IN CLUSTERS [facility]formed at [facility]    Culture   Final    ABUNDANT METHICILLIN RESISTANT STAPHYLOCOCCUS AUREUS Note: RIFAMPIN AND GENTAMICIN SHOULD NOT BE USED AS SINGLE DRUGS FOR TREATMENT OF STAPH INFECTIONS. This organism DOES NOT demonstrate inducible Clindamycin resistance in vitro. CRITICAL RESULT CALLED TO, READ BACK BY AND VERIFIED WITH: BRENDA H. AT  9:14AM ON 06/08/2015 [name] Performed at [facility]    Report Status 06/08/2015 FINAL  Final   Organism ID, Bacteria METHICILLIN RESISTANT STAPHYLOCOCCUS AUREUS  Final      Susceptibility   Methicillin resistant staphylococcus aureus - MIC*    CLINDAMYCIN <=0.25 SENSITIVE Sensitive     ERYTHROMYCIN >=8 RESISTANT Resistant     GENTAMICIN <=0.5 SENSITIVE  Sensitive     LEVOFLOXACIN 0.25 SENSITIVE Sensitive     OXACILLIN >=4 RESISTANT Resistant     RIFAMPIN <=0.5 SENSITIVE Sensitive     TRIMETH/SULFA <=10 SENSITIVE Sensitive     VANCOMYCIN <=0.5 SENSITIVE Sensitive     TETRACYCLINE <=1 SENSITIVE Sensitive     * ABUNDANT METHICILLIN RESISTANT STAPHYLOCOCCUS AUREUS  Anaerobic culture     Status: None (Preliminary result)   Collection Time: 06/05/15 10:04 PM  Result Value Ref Range Status   Specimen Description ABSCESS BACK  Final   Special Requests NONE  Final   Gram Stain   Final    ABUNDANT WBC PRESENT, PREDOMINANTLY PMN NO SQUAMOUS EPITHELIAL CELLS SEEN ABUNDANT GRAM POSITIVE COCCI IN CLUSTERS Performed at [facility]    Culture   Final    NO ANAEROBES ISOLATED; CULTURE IN PROGRESS FOR 5 DAYS Performed at [facility]    Report Status PENDING  Incomplete     Studies: No results found.  Scheduled Meds: . collagenase   Topical Daily  . doxycycline  100 mg Oral Q12H  . enoxaparin (LOVENOX) injection  30 mg Subcutaneous Q24H  . fluconazole  100 mg Oral Daily  . Influenza vac split quadrivalent PF  0.5 mL Intramuscular Tomorrow-1000  . insulin aspart  0-9 Units Subcutaneous TID WC  . insulin aspart  3 Units Subcutaneous TID WC  . insulin glargine  20 Units Subcutaneous QHS  . nicotine  21 mg Transdermal Daily  . sodium chloride flush  3 mL Intravenous Q12H    Continuous Infusions: . sodium chloride 125 mL/hr at 06/08/15 2207  . lactated ringers 10 mL/hr at 06/05/15 1717     Time spent: 15mins  Sara Vastine MD, PhD  [facility] Pager [phone]. If 7PM-7AM, please contact night-coverage at [email or web address], password [email or web address] 06/09/2015, 10:37 AM  LOS: 5 days

## 2015-06-09 NOTE — Progress Notes (Signed)
4 Days Post-Op  Subjective: Stable and alert.  Excellent pain control. No wound bleeding. Started Santyl to back wound yesterday Culture growing MRSA She is on doxycycline.  Capillary glucose ranged from 139 up to 320. Creatinine slightly better at 2.28.  GFR 25.  WBC 10,700, slowly calming down.  Hemoglobin stable at 10.8.  No wound bleeding.  Objective: Vital signs in last 24 hours: Temp:  [97.6 F (36.4 C)-99 F (37.2 C)] 99 F (37.2 C) (03/26 0616) Pulse Rate:  [74-76] 74 (03/26 0616) Resp:  [16] 16 (03/26 0616) BP: (130-161)/(73-95) 143/79 mmHg (03/26 0616) SpO2:  [98 %-100 %] 98 % (03/26 0616) Last BM Date: 06/06/15  Intake/Output from previous day: 03/25 0701 - 03/26 0700 In: 8529.3 [P.O.:1320; I.V.:7209.3] Out: 2950 [Urine:2950] Intake/Output this shift:    General appearance: Alert.  Cooperative.  No distress. GI: Soft and nontender.  Nondistended. Incision/Wound: I examined the left lower quadrant wound this morning.  It is clean and being packed twice a day.  No undrained purulent fluid collections.  No further necrosis.  I did not examine the back this morning.  Lab Results:  Results for orders placed or performed during the hospital encounter of 06/03/15 (from the past 24 hour(s))  Urinalysis, Routine w reflex microscopic (not [facility])     Status: Abnormal   Collection Time: 06/08/15  9:59 AM  Result Value Ref Range   Color, Urine YELLOW YELLOW   APPearance CLEAR CLEAR   Specific Gravity, Urine 1.004 (L) 1.005 - 1.030   pH 6.5 5.0 - 8.0   Glucose, UA NEGATIVE NEGATIVE mg/dL   Hgb urine dipstick MODERATE (A) NEGATIVE   Bilirubin Urine NEGATIVE NEGATIVE   Ketones, ur NEGATIVE NEGATIVE mg/dL   Protein, ur NEGATIVE NEGATIVE mg/dL   Nitrite NEGATIVE NEGATIVE   Leukocytes, UA NEGATIVE NEGATIVE  Urine microscopic-add on     Status: Abnormal   Collection Time: 06/08/15  9:59 AM  Result Value Ref Range   Squamous Epithelial / LPF 0-5 (A) NONE SEEN   WBC, UA  0-5 0 - 5 WBC/hpf   RBC / HPF 0-5 0 - 5 RBC/hpf   Bacteria, UA NONE SEEN NONE SEEN   Urine-Other YEAST PRESENT   Glucose, capillary     Status: Abnormal   Collection Time: 06/08/15 11:41 AM  Result Value Ref Range   Glucose-Capillary 178 (H) 65 - 99 mg/dL  Glucose, capillary     Status: Abnormal   Collection Time: 06/08/15  5:24 PM  Result Value Ref Range   Glucose-Capillary 228 (H) 65 - 99 mg/dL  Glucose, capillary     Status: Abnormal   Collection Time: 06/08/15 10:19 PM  Result Value Ref Range   Glucose-Capillary 320 (H) 65 - 99 mg/dL   Comment 1 Notify RN    Comment 2 Document in Chart   CBC     Status: Abnormal   Collection Time: 06/09/15  6:45 AM  Result Value Ref Range   WBC 10.7 (H) 4.0 - 10.5 K/uL   RBC 3.64 (L) 3.87 - 5.11 MIL/uL   Hemoglobin 10.8 (L) 12.0 - 15.0 g/dL   HCT 40.933.5 (L) 81.136.0 - 91.446.0 %   MCV 92.0 78.0 - 100.0 fL   MCH 29.7 26.0 - 34.0 pg   MCHC 32.2 30.0 - 36.0 g/dL   RDW 78.212.4 95.611.5 - 21.315.5 %   Platelets 418 (H) 150 - 400 K/uL  Basic metabolic panel     Status: Abnormal   Collection Time: 06/09/15  6:45 AM  Result Value Ref Range   Sodium 140 135 - 145 mmol/L   Potassium 4.0 3.5 - 5.1 mmol/L   Chloride 112 (H) 101 - 111 mmol/L   CO2 21 (L) 22 - 32 mmol/L   Glucose, Bld 169 (H) 65 - 99 mg/dL   BUN 19 6 - 20 mg/dL   Creatinine, Ser 1.61 (H) 0.44 - 1.00 mg/dL   Calcium 8.1 (L) 8.9 - 10.3 mg/dL   GFR calc non Af Amer 25 (L) >60 mL/min   GFR calc Af Amer 29 (L) >60 mL/min   Anion gap 7 5 - 15  Glucose, capillary     Status: Abnormal   Collection Time: 06/09/15  7:15 AM  Result Value Ref Range   Glucose-Capillary 139 (H) 65 - 99 mg/dL     Studies/Results: No results found.  . collagenase   Topical Daily  . doxycycline  100 mg Oral Q12H  . enoxaparin (LOVENOX) injection  30 mg Subcutaneous Q24H  . fluconazole  100 mg Oral Daily  . Influenza vac split quadrivalent PF  0.5 mL Intramuscular Tomorrow-1000  . insulin aspart  0-9 Units Subcutaneous TID  WC  . insulin aspart  3 Units Subcutaneous TID WC  . insulin glargine  20 Units Subcutaneous QHS  . nicotine  21 mg Transdermal Daily  . sodium chloride flush  3 mL Intravenous Q12H     Assessment/Plan: s/p Procedure(s): INCISION AND DRAINAGE ABSCESS/ABDOMEN AND BACK   POD#4--- I&D back and abdominal wall abscess---Dr. Corliss Skains -abdominal wall wound is clean, continue BID wet to dry dressing changes. -Back wound to receive twice a day wet to dry dressings today, resume hydrotherapy and Santyl tomorrow.  -I think we have source control on her sepsis -Not ready for outpatient management yet.   ID-MRSA on final culture. On Doxycycline --this should be adequate  FEN-no issues DM II-on insulin . Still working on better control. AKI-sCr slightly better...  Management per primary team  VTE prophylaxis-SCD/lovenox  Dispo-6N, PT hydro   @  LOS: 5 days    Temica Righetti M 06/09/2015  . .prob

## 2015-06-10 DIAGNOSIS — E119 Type 2 diabetes mellitus without complications: Secondary | ICD-10-CM

## 2015-06-10 DIAGNOSIS — Z794 Long term (current) use of insulin: Secondary | ICD-10-CM

## 2015-06-10 DIAGNOSIS — IMO0001 Reserved for inherently not codable concepts without codable children: Secondary | ICD-10-CM | POA: Insufficient documentation

## 2015-06-10 LAB — ANAEROBIC CULTURE

## 2015-06-10 LAB — GLUCOSE, CAPILLARY
GLUCOSE-CAPILLARY: 228 mg/dL — AB (ref 65–99)
Glucose-Capillary: 126 mg/dL — ABNORMAL HIGH (ref 65–99)

## 2015-06-10 LAB — BASIC METABOLIC PANEL
ANION GAP: 9 (ref 5–15)
BUN: 16 mg/dL (ref 6–20)
CHLORIDE: 112 mmol/L — AB (ref 101–111)
CO2: 21 mmol/L — AB (ref 22–32)
Calcium: 8.5 mg/dL — ABNORMAL LOW (ref 8.9–10.3)
Creatinine, Ser: 2.04 mg/dL — ABNORMAL HIGH (ref 0.44–1.00)
GFR calc Af Amer: 34 mL/min — ABNORMAL LOW (ref >60)
GFR calc non Af Amer: 29 mL/min — ABNORMAL LOW (ref >60)
GLUCOSE: 112 mg/dL — AB (ref 65–99)
POTASSIUM: 4 mmol/L (ref 3.5–5.1)
Sodium: 142 mmol/L (ref 135–145)

## 2015-06-10 LAB — CBC
HEMATOCRIT: 32.5 % — AB (ref 36.0–46.0)
Hemoglobin: 10.6 g/dL — ABNORMAL LOW (ref 12.0–15.0)
MCH: 29.6 pg (ref 26.0–34.0)
MCHC: 32.6 g/dL (ref 30.0–36.0)
MCV: 90.8 fL (ref 78.0–100.0)
Platelets: 463 10*3/uL — ABNORMAL HIGH (ref 150–400)
RBC: 3.58 MIL/uL — AB (ref 3.87–5.11)
RDW: 12.4 % (ref 11.5–15.5)
WBC: 10.2 10*3/uL (ref 4.0–10.5)

## 2015-06-10 MED ORDER — ENOXAPARIN SODIUM 40 MG/0.4ML ~~LOC~~ SOLN
40.0000 mg | SUBCUTANEOUS | Status: DC
  Administered 2015-06-10: 40 mg via SUBCUTANEOUS
  Filled 2015-06-10: qty 0.4

## 2015-06-10 MED ORDER — DOXYCYCLINE HYCLATE 100 MG PO TABS: 100.0000 mg | ORAL_TABLET | Freq: Two times a day (BID) | ORAL | Status: DC

## 2015-06-10 MED ORDER — FLUCONAZOLE 100 MG PO TABS: 100.0000 mg | ORAL_TABLET | Freq: Every day | ORAL | Status: DC

## 2015-06-10 MED FILL — FLUCONAZOLE 100 MG TABLET: 100 | 2 days supply | Qty: 2 | Fill #0

## 2015-06-10 MED FILL — TRUEplus LANCETS 28G MISC: 25 days supply | Qty: 100 | Fill #0

## 2015-06-10 MED FILL — NOVOLIN 70/30 100 UNITS/ML: (70-30) 100 | 30 days supply | Qty: 10 | Fill #0

## 2015-06-10 MED FILL — TRUE METRIX BLOOD GLUCOSE M: W/DEVICE | 365 days supply | Qty: 1 | Fill #0

## 2015-06-10 MED FILL — TRUE METRIX TEST STRIP: 25 days supply | Qty: 100 | Fill #0

## 2015-06-10 MED FILL — DOXYCYCLINE 100 MG TABLET: 100 | 6 days supply | Qty: 12 | Fill #0

## 2015-06-10 NOTE — Progress Notes (Signed)
5 Days Post-Op  Subjective: She looks good, open wounds all look good. Getting Hydro Rx to back wound.    Objective: Vital signs in last 24 hours: Temp:  [97.9 F (36.6 C)-98.5 F (36.9 C)] 98.5 F (36.9 C) (03/27 16100613) Pulse Rate:  [64-69] 69 (03/27 0613) Resp:  [18-20] 19 (03/27 0613) BP: (144-153)/(75-92) 149/87 mmHg (03/27 0613) SpO2:  [99 %-100 %] 99 % (03/27 0613) Last BM Date: 06/08/15 960 PO 3600 urine No BM recorded Afebrile, VSS WBC is improving, creatinine is better. Intake/Output from previous day: 03/26 0701 - 03/27 0700 In: 1902.5 [P.O.:960; I.V.:942.5] Out: 3600 [Urine:3600] Intake/Output this shift: Total I/O In: 100 [I.V.:100] Out: -   General appearance: alert, cooperative and no distress Resp: clear to auscultation bilaterally Chest wall: back open site is clean and looks good. GI: open site is clean and I taught daughter how to pack.    Lab Results:   Recent Labs  06/09/15 0645 06/10/15 0518  WBC 10.7* 10.2  HGB 10.8* 10.6*  HCT 33.5* 32.5*  PLT 418* 463*    BMET  Recent Labs  06/09/15 0645 06/10/15 0518  NA 140 142  K 4.0 4.0  CL 112* 112*  CO2 21* 21*  GLUCOSE 169* 112*  BUN 19 16  CREATININE 2.28* 2.04*  CALCIUM 8.1* 8.5*   PT/INR No results for input(s): LABPROT, INR in the last 72 hours.   Recent Labs Lab 06/04/15 0244  AST 15  ALT 17  ALKPHOS 71  BILITOT 0.4  PROT 5.4*  ALBUMIN 2.1*     Lipase  No results found for: LIPASE   Studies/Results: No results found.  Medications: . collagenase   Topical Daily  . doxycycline  100 mg Oral Q12H  . enoxaparin (LOVENOX) injection  30 mg Subcutaneous Q24H  . fluconazole  100 mg Oral Daily  . insulin aspart  0-9 Units Subcutaneous TID WC  . insulin aspart  3 Units Subcutaneous TID WC  . insulin glargine  20 Units Subcutaneous QHS  . nicotine  21 mg Transdermal Daily  . sodium chloride flush  3 mL Intravenous Q12H   Wound culture 3/21/1/7 ABUNDANT METHICILLIN  RESISTANT STAPHYLOCOCCUS AUREUS  Note: RIFAMPIN AND GENTAMICIN SHOULD NOT BE USED AS SINGLE DRUGS FOR TREATMENT OF STAPH INFECTIONS. This organism DOES NOT demonstrate inducible Clindamycin resistance in vitro.           Assessment/Plan Upper back abscess, Abdominal wall abscess left lower quadrant S/p 1 sharp debridement of back abscess (skin, subcutaneous tissue, and muscle - total area 7 x 6 x 4 cm) 2. Sharp debridement of abdominal wall abscess (Skin, subcutaneous tissue - 7 x 7 x 4 cm), 06/05/15,Dr. Corliss Skainssuei [facility] Tobacco abuse Antibiotics:  Day 8 antibiotics, day 4 Doxycycline  DVT:  Lovenox/SCD   Plan:  From our standpoint she can go home with wet to dry dressing changes BID.  She can shower and get soap and water into the sites.  I will get her follow up in the clinic in 2-3 weeks.  I have put info in AVS.      LOS: 6 days    Ha Shannahan 06/10/2015 [phone]

## 2015-06-10 NOTE — Progress Notes (Signed)
Sara Marshall to be D/C'd Home per MD order.  Discussed with the patient and all questions fully answered.  VSS. IV catheter discontinued intact. Site without signs and symptoms of complications. Dressing and pressure applied.  An After Visit Summary was printed and given to the patient. Patient received prescription.  D/c education completed with patient/family including follow up instructions, medication list, d/c activities limitations if indicated, with other d/c instructions as indicated by MD - patient able to verbalize understanding, all questions fully answered. Interpretor used and handouts provided in spanish.  Patient instructed to return to ED, call 911, or call MD for any changes in condition.   Patient to discharge escorted via WC, and D/C home via private auto.  L'ESPERANCE, [name] 06/10/2015 2:23 PM

## 2015-06-10 NOTE — Discharge Instructions (Signed)
Dressing Change A dressing is a material placed over wounds. It keeps the wound clean, dry, and protected from further injury.  BEFORE YOU BEGIN  Get your supplies together. Things you may need include:  Salt solution (saline).  Flexible gauze bandage.  Sterile normal saline.  Tape.  Gloves.  Belly (abdominal) pads.  Gauze squares.  Plastic bags.  Take pain medicine 30 minutes before the bandage change if you need it.  Take a shower before you do the first bandage change of the day. Put plastic wrap or a bag over the dressing. REMOVING YOUR OLD BANDAGE  Wash your hands with soap and water. Dry your hands with a clean towel.  Put on your gloves.  Remove any tape.  Remove the old bandage as told. If it sticks, put a small amount of warm water on it to loosen the bandage.  Remove any gauze or packing tape in your wound.  Take off your gloves.  Put the gloves, tape, gauze, or any packing tape in a plastic bag. CHANGING YOUR BANDAGE  Open the supplies.  Take the cap off the salt solution.  Open the gauze. Leave the gauze on the inside of the package.  Put on your gloves.  Clean your wound as told by your doctor.  Keep your wound dry if your doctor told you to do so.  Your doctor may tell you to do one or more of the following:  Pick up the gauze. Pour the salt solution over the gauze. Squeeze out the extra salt solution.  Put medicated cream or other medicine on your wound.  Put solution soaked gauze only in your wound, not on the skin around it.  Pack your wound loosely.  Put dry gauze on your wound.  Put belly pads over the dry gauze if your bandages soak through.  Tape the bandage in place so it will not fall off. Do not wrap the tape all the way around your arm or leg.  Wrap the bandage with the flexible gauze bandage as told by your doctor.  Take off your gloves. Put them in the plastic bag with the old bandage. Tie the bag shut and throw it  away.  Keep the bandage clean and dry.  Wash your hands. GET HELP RIGHT AWAY IF:   Your skin around the wound looks red.  Your wound feels more tender or sore.  You see yellowish-white fluid (pus) in the wound.  Your wound smells bad.  You have a fever.  Your skin around the wound has a red rash that itches and burns.  You see black or yellow skin in your wound that was not there before.  You feel sick to your stomach (nauseous), throw up (vomit), and feel very tired.   This information is not intended to replace advice given to you by your health care provider. Make sure you discuss any questions you have with your health care provider.   Document Released: 05/29/2008 Document Revised: 03/23/2014 Document Reviewed: 01/11/2011 Elsevier Interactive Patient Education [employer]

## 2015-06-10 NOTE — Care Management Note (Signed)
Case Management Note  Patient Details  Name: Sara Marshall MRN: [id number] Date of Birth: 08/08/1970  Subjective/Objective:                    Action/Plan:  Spoke with daughter at bedside . Daughter has been shown wet to dry dressing changes and feels comfortable without a HHRN .  Expected Discharge Date:                  Expected Discharge Plan:  Home/Self Care  In-House Referral:     Discharge planning Services  [facility], Indigent Health Clinic, Medication Assistance, CM Consult  Post Acute Care Choice:  Home Health Choice offered to:  Patient, Adult Children  DME Arranged:    DME Agency:     HH Arranged:  Patient Refused HH Agency:     Status of Service:  Completed, signed off  Medicare Important Message Given:    Date Medicare IM Given:    Medicare IM give by:    Date Additional Medicare IM Given:    Additional Medicare Important Message give by:     If discussed at Long Length of Stay Meetings, dates discussed:    Additional Comments:  Kingsley PlanWile, Bowden Boody Marie, RN 06/10/2015, 2:25 PM

## 2015-06-10 NOTE — Discharge Summary (Signed)
Discharge Summary  Sara Marshall AOZ:[id number] DOB: Feb 10, 1971  PCP: No PCP Per Patient  Admit date: 06/03/2015 Discharge date: 06/10/2015  Time spent: <24mns  Recommendations for Outpatient Follow-up:  1. F/u with PMD within a week  for hospital discharge follow up, repeat cbc/bmp at follow up, pmd to monitor blood sugar control 2. F/u with general surgery for abscess 3. Patient has refused Home health RN for wound care and dressing changes, patient 's daughter was taught for dressing changes prior to discharge  Discharge Diagnoses:  Active Hospital Problems   Diagnosis Date Noted  . Abscess and cellulitis 06/04/2015  . Insulin dependent diabetes mellitus ([facility]   . Sepsis ([facility] 06/04/2015  . Abscess 06/04/2015  . Tobacco abuse   . Diabetes mellitus without complication ([facility]   . Hyperglycemia     Resolved Hospital Problems   Diagnosis Date Noted Date Resolved  No resolved problems to display.    Discharge Condition: stable  Diet recommendation: heart healthy/carb modified  Filed Weights   06/05/15 0621 06/06/15 0609 06/07/15 0539  Weight: 74.345 kg (163 lb 14.4 oz) 74.39 kg (164 lb) 78.427 kg (172 lb 14.4 oz)    History of present illness:  CDezyre Hoeferis a 45y.o. female with PMH of tobacco abuse, diabetes mellitus, recurrent abscess, who presents with abscess over back and abdomen.  Patient speaks Spanish, cannot speak [place] History is obtained via translated on the phone. Pt reports that she has a painful, big and draining abscess at the upper mid back which started 2 days ago. She also has another smaller abscess over left lower abdomen which started 3 days ago. Patient has subjective fever and chills. No nausea, vomiting, diarrhea, abdominal pain, chest pain, shortness breath, cough, symptoms of UTI or unilateral weakness. Pt states that she gets frequent abscesses, but has never had to be hospitalized for one. She has no PCP. Her DM is treated with pills but she  is unsure which ones. Pt states that she does not measure her blood sugar at home.  In ED, patient was found to have WBC 15.5, lactate 1.49, temperature normal, tachycardia, pseudohyponatremia with sodium 132, anion gap 16, blood sugar 511, bicarbonate 23. Patient is admitted to inpatient for further prevention treatment and observation.  # US-soft tissue of back: edema in the subcutaneous fat of the left upper back with soft tissue gas collections present. Changes are consistent with cellulitis. Soft tissue gas could be due to gas-forming organism or fistula. No discrete abscess collection is identified.  # US-soft tissue of LLQ of abdomen: soft tissue edema consistent with cellulitis. Soft tissue gas may indicate gas-forming organism or fistula. No discrete abscess collection identified.  EKG: Independently reviewed. QTC 413, occasional PVC  Hospital Course:  Principal Problem:   Abscess and cellulitis Active Problems:   Diabetes mellitus without complication (HCC)   Tobacco abuse   Abscess   Sepsis ([facility]   Hyperglycemia   Insulin dependent diabetes mellitus (HO[facility]  Sepsis due to abscess and cellulitis over left upper back and left lower abdomen:  pt is septic with leukocytosis and tachycardia. Lactate level is normal. Hemodynamic stable. The lesion in left upper back looks deep, may need surgical debridement.  - Empiric antimicrobial treatment with vancomycin and Zosyn per pharmacy initially - Blood cultures x 2 , no growth, wound culture + MRSA, Preg test negative -s/p i&d on 3/22, abx changed to oral doxycycline on 3/23 per culture and sensitivity result. Started hydrotherapy on 3/24,  -patient is cleared  to discharge home by general surgery, general surgery input appreciated  DM-II/hyperglycemia/elevated anion gap initially on presentation:  -only on metformin at home per care everywhere, per patient has dm2 for 68yr, has been taking her mother's medicines, no pcp. -A1c  12.7 -Aggressive IV fluid treatment: 3L of normal saline bolus, followed by 125 mL per hour, adjust insulin, diabetic education, care manger consult. -patient is discharged home with novolog 10units bid, pmd to continue monitor blood sugar control.  ARF: cr up to 2.57, renal uKoreano hydronephrosis, repeat ua + yeast, will treat with diflucan due to poorly controlled dm2 and acute increase of cr. continue ivf, avoid nephrotoxin, Cr slowing improving, pmd to repeat bmp at follow up  Hypokalemia: replace k, mag wnl.  Tobacco abuse: -Did counseling about importance of quitting smoking -Nicotine patch    Code Status: full  Family Communication: patient and daughter in room  Disposition Plan: home on 3/27   Consultants:  General surgery  Procedures:  I&D to back and left flank abscess on 3/22  Hydrotherapy from Monday to saturday  Antibiotics:  Vanc/ zosyn from admission to 3/23  Doxycycline from 3/23  Discharge Exam: BP 149/87 mmHg  Pulse 69  Temp(Src) 98.5 F (36.9 C) (Oral)  Resp 19  Ht '5\' 5"'  (1.651 m)  Wt 78.427 kg (172 lb 14.4 oz)  BMI 28.77 kg/m2  SpO2 99%   General: NAD  Cardiovascular: RRR  Respiratory: CTABL  Abdomen: Soft/ND/NT, positive BS  Musculoskeletal: No Edema  Neuro: aaox3  Skin: upper back and left lower abdominal wall abscesses s/p i&d, dressing intact   Discharge Instructions You were cared for by a hospitalist during your hospital stay. If you have any questions about your discharge medications or the care you received while you were in the hospital after you are discharged, you can call the unit and asked to speak with the hospitalist on call if the hospitalist that took care of you is not available. Once you are discharged, your primary care physician will handle any further medical issues. Please note that NO REFILLS for any discharge medications will be authorized once you are discharged, as it is imperative that you return to  your primary care physician (or establish a relationship with a primary care physician if you do not have one) for your aftercare needs so that they can reassess your need for medications and monitor your lab values.      Discharge Instructions    Change dressing    Complete by:  As directed   Patient may shower and take dressings out in shower.  She can let soap and water run into the wound.  After shower, pack each site with open 4 x 4's, wet with sterile normal saline, then a dry dressing after that.  Dressing changes twice a day till healed.     Diet - low sodium heart healthy    Complete by:  As directed   Carb modified     Face-to-face encounter (required for Medicare/Medicaid patients)    Complete by:  As directed   I Sharnika Binney certify that this patient is under my care and that I, or a nurse practitioner or physician's assistant working with me, had a face-to-face encounter that meets the physician face-to-face encounter requirements with this patient on 06/10/2015. The encounter with the patient was in whole, or in part for the following medical condition(s) which is the primary reason for home health care (List medical condition): wound care instruction per general surgery,  wet to dry dressing  Bid  The encounter with the patient was in whole, or in part, for the following medical condition, which is the primary reason for home health care:  wound care  I certify that, based on my findings, the following services are medically necessary home health services:  Nursing  Reason for Medically Necessary Home Health Services:  Skilled Nursing- Change/Decline in Patient Status  My clinical findings support the need for the above services:  OTHER SEE COMMENTS  Further, I certify that my clinical findings support that this patient is homebound due to:  Immunocompromised     For home use only DME Glucometer    Complete by:  As directed      Home Health    Complete by:  As directed   To provide the  following care/treatments:  RN     Increase activity slowly    Complete by:  As directed             Medication List    STOP taking these medications        PRESCRIPTION MEDICATION      TAKE these medications        blood glucose meter kit and supplies Kit  Dispense based on patient and insurance preference. Use up to four times daily as directed. (FOR ICD-9 250.00, 250.01).     doxycycline 100 MG tablet  Commonly known as:  VIBRA-TABS  Take 1 tablet (100 mg total) by mouth every 12 (twelve) hours.     fluconazole 100 MG tablet  Commonly known as:  DIFLUCAN  Take 1 tablet (100 mg total) by mouth daily.     insulin NPH-regular Human (70-30) 100 UNIT/ML injection  Commonly known as:  NOVOLIN 70/30  Inject 10 Units into the skin 2 (two) times daily with a meal.       No Known Allergies Follow-up Information    Follow up with TSUEI,MATTHEW K., MD In 2 weeks.   Specialty:  General Surgery   Why:  abscess   Contact information:   [address] [place] [address] [phone]       Schedule an appointment as soon as possible for a visit with [facility].   Why:  repeat basic lab works including cbc/bmp at follow up, futher blood sugar control per pmd   Contact information:   [address] [place] [address] [phone]      Follow up with [facility] On 06/26/2015.   Specialty:  General Surgery   Why:  You have an appointment at 10:45 AM.  Be at the clinic at least 30 minutes early for check in.     Contact information:   [address] [place] [address] [phone]       Follow up with [facility].   Contact information:   [address] [place] [address] [phone]        The results of significant diagnostics from this hospitalization (including imaging, microbiology, ancillary and laboratory) are listed below for reference.     Significant Diagnostic Studies: US Renal  06/07/2015  CLINICAL DATA:  Elevated creatinine. EXAM: RENAL / URINARY TRACT ULTRASOUND COMPLETE COMPARISON:  None. FINDINGS: Right Kidney: Length: 12.9 cm. Mildly increased parenchymal echogenicity. Trace perinephric fluid adjacent to the upper goal. No mass or hydronephrosis visualized. Left Kidney: Length: 13.6 cm. Minimally increased parenchymal echogenicity. No mass or hydronephrosis visualized.  Bladder: Appears normal for degree of bladder distention. IMPRESSION: Mildly echogenic kidneys consistent with medical renal disease. No hydronephrosis. Electronically Signed   By: Logan Bores M.D.   On: 06/07/2015 16:54   [place] Misc Soft Tissue  06/04/2015  CLINICAL DATA:  Lower abdominal abscess for 4 days. EXAM: LIMITED ULTRASOUND OF ABDOMINAL SOFT TISSUES TECHNIQUE: Ultrasound examination of the abdominal wall soft tissues was performed in the area of clinical concern. COMPARISON:  None. FINDINGS: Ultrasound examination of the left lower abdomen soft tissues is obtained. The area of involvement is warm and red. Images obtained demonstrate edema in the subcutaneous soft tissues with multiple hyperechoic foci consistent with soft tissue gas. No discrete loculated fluid collections to suggest abscess. No significant increased flow on color flow Doppler imaging. IMPRESSION: Ultrasound examination of area of abnormality in the left lower abdomen demonstrates soft tissue edema consistent with cellulitis. Soft tissue gas may indicate gas-forming organism or fistula. No discrete abscess collection identified. Electronically Signed   By: Lucienne Capers M.D.   On: 06/04/2015 03:32   [place] Misc Soft Tissue  06/04/2015  CLINICAL DATA:  Draining abscess to the middle of the back for 2 days. EXAM: ULTRASOUND OF CHEST SOFT TISSUES TECHNIQUE: Ultrasound examination of the chest wall soft tissues was performed in the area of clinical concern. COMPARISON:  None. FINDINGS: Ultrasound  examination of the left upper back is obtained with imaging of and area of swelling and redness. Images obtained demonstrate subcutaneous soft tissue edema. No discrete loculated fluid collection to suggest an abscess. Areas of mixed echotexture with hyperechoic foci and shadowing consistent with soft tissue gas. No significant hyperemia with color flow Doppler imaging. IMPRESSION: Edema in the subcutaneous fat of the left upper back with soft tissue gas collections present. Changes are consistent with cellulitis. Soft tissue gas could be due to gas-forming organism or fistula. No discrete abscess collection is identified. Electronically Signed   By: Lucienne Capers M.D.   On: 06/04/2015 03:29    Microbiology: Recent Results (from the past 240 hour(s))  Blood culture (routine x 2)     Status: None   Collection Time: 06/03/15 11:51 PM  Result Value Ref Range Status   Specimen Description BLOOD RIGHT ARM  Final   Special Requests BOTTLES DRAWN AEROBIC AND ANAEROBIC 5ML  Final   Culture NO GROWTH 5 DAYS  Final   Report Status 06/09/2015 FINAL  Final  Blood culture (routine x 2)     Status: None   Collection Time: 06/03/15 11:57 PM  Result Value Ref Range Status   Specimen Description BLOOD LEFT ARM  Final   Special Requests BOTTLES DRAWN AEROBIC AND ANAEROBIC 5ML  Final   Culture NO GROWTH 5 DAYS  Final   Report Status 06/09/2015 FINAL  Final  Wound culture     Status: None   Collection Time: 06/04/15 12:45 AM  Result Value Ref Range Status   Specimen Description ABSCESS  Final   Special Requests BACK  Final   Gram Stain   Final    MODERATE WBC PRESENT, PREDOMINANTLY PMN NO SQUAMOUS EPITHELIAL CELLS SEEN ABUNDANT GRAM POSITIVE COCCI IN CLUSTERS Performed at [facility]    Culture   Final    ABUNDANT METHICILLIN RESISTANT STAPHYLOCOCCUS AUREUS Note: RIFAMPIN AND GENTAMICIN SHOULD NOT BE USED AS SINGLE DRUGS FOR TREATMENT OF STAPH INFECTIONS. This organism DOES NOT demonstrate  inducible Clindamycin resistance in vitro. CRITICAL RESULT CALLED TO, READ BACK BY AND VERIFIED WITH: BLAIR Q. AT  9:13AM ON 06/06/2015 [name] Performed at [facility]    Report Status 06/06/2015 FINAL  Final   Organism ID, Bacteria METHICILLIN RESISTANT STAPHYLOCOCCUS AUREUS  Final      Susceptibility   Methicillin resistant staphylococcus aureus - MIC*    CLINDAMYCIN <=0.25 SENSITIVE Sensitive     ERYTHROMYCIN >=8 RESISTANT Resistant     GENTAMICIN <=0.5 SENSITIVE Sensitive     LEVOFLOXACIN 0.25 SENSITIVE Sensitive     OXACILLIN >=4 RESISTANT Resistant     RIFAMPIN <=0.5 SENSITIVE Sensitive     TRIMETH/SULFA <=10 SENSITIVE Sensitive     VANCOMYCIN <=0.5 SENSITIVE Sensitive     TETRACYCLINE <=1 SENSITIVE Sensitive     * ABUNDANT METHICILLIN RESISTANT STAPHYLOCOCCUS AUREUS  MRSA PCR Screening     Status: None   Collection Time: 06/05/15 10:38 AM  Result Value Ref Range Status   MRSA by PCR NEGATIVE NEGATIVE Final    Comment:        The GeneXpert MRSA Assay (FDA approved for NASAL specimens only), is one component of a comprehensive MRSA colonization surveillance program. It is not intended to diagnose MRSA infection nor to guide or monitor treatment for MRSA infections.   Culture, routine-abscess     Status: None   Collection Time: 06/05/15 10:03 PM  Result Value Ref Range Status   Specimen Description ABSCESS BACK  Final   Special Requests NONE  Final   Gram Stain   Final    ABUNDANT WBC PRESENT, PREDOMINANTLY PMN NO SQUAMOUS EPITHELIAL CELLS SEEN ABUNDANT GRAM POSITIVE COCCI IN CLUSTERS Performed at [facility]    Culture   Final    ABUNDANT METHICILLIN RESISTANT STAPHYLOCOCCUS AUREUS Note: RIFAMPIN AND GENTAMICIN SHOULD NOT BE USED AS SINGLE DRUGS FOR TREATMENT OF STAPH INFECTIONS. This organism DOES NOT demonstrate inducible Clindamycin resistance in vitro. CRITICAL RESULT CALLED TO, READ BACK BY AND VERIFIED WITH: BRENDA H. AT  9:14AM ON 06/08/2015  [name] Performed at [facility]    Report Status 06/08/2015 FINAL  Final   Organism ID, Bacteria METHICILLIN RESISTANT STAPHYLOCOCCUS AUREUS  Final      Susceptibility   Methicillin resistant staphylococcus aureus - MIC*    CLINDAMYCIN <=0.25 SENSITIVE Sensitive     ERYTHROMYCIN >=8 RESISTANT Resistant     GENTAMICIN <=0.5 SENSITIVE Sensitive     LEVOFLOXACIN 0.25 SENSITIVE Sensitive     OXACILLIN >=4 RESISTANT Resistant     RIFAMPIN <=0.5 SENSITIVE Sensitive     TRIMETH/SULFA <=10 SENSITIVE Sensitive     VANCOMYCIN <=0.5 SENSITIVE Sensitive     TETRACYCLINE <=1 SENSITIVE Sensitive     * ABUNDANT METHICILLIN RESISTANT STAPHYLOCOCCUS AUREUS  Anaerobic culture     Status: None   Collection Time: 06/05/15 10:04 PM  Result Value Ref Range Status   Specimen Description ABSCESS BACK  Final   Special Requests NONE  Final   Gram Stain   Final    ABUNDANT WBC PRESENT, PREDOMINANTLY PMN NO SQUAMOUS EPITHELIAL CELLS SEEN ABUNDANT GRAM POSITIVE COCCI IN CLUSTERS Performed at [facility]    Culture   Final    NO ANAEROBES ISOLATED Performed at [facility]    Report Status 06/10/2015 FINAL  Final     Labs: Basic Metabolic Panel:  Recent Labs Lab 06/06/15 0330 06/07/15 0430 06/08/15 0421 06/09/15 0645 06/10/15 0518  NA 139 137 140 140 142  K 3.3* 3.7 3.7 4.0 4.0  CL 103 106 111 112* 112*  CO2 25 22 20* 21* 21*  GLUCOSE 294*  228* 171* 169* 112*  BUN '12 19 18 19 16  ' CREATININE 1.19* 2.57* 2.37* 2.28* 2.04*  CALCIUM 7.9* 7.9* 7.8* 8.1* 8.5*  MG 1.7  --   --   --   --    Liver Function Tests:  Recent Labs Lab 06/04/15 0244  AST 15  ALT 17  ALKPHOS 71  BILITOT 0.4  PROT 5.4*  ALBUMIN 2.1*   No results for input(s): LIPASE, AMYLASE in the last 168 hours. No results for input(s): AMMONIA in the last 168 hours. CBC:  Recent Labs Lab 06/06/15 0330 06/07/15 0430 06/08/15 0421 06/09/15 0645 06/10/15 0518  WBC 13.0* 12.0* 11.5* 10.7* 10.2    HGB 11.6* 11.4* 10.6* 10.8* 10.6*  HCT 34.5* 34.8* 32.6* 33.5* 32.5*  MCV 92.0 91.8 91.1 92.0 90.8  PLT 336 360 374 418* 463*   Cardiac Enzymes: No results for input(s): CKTOTAL, CKMB, CKMBINDEX, TROPONINI in the last 168 hours. BNP: BNP (last 3 results) No results for input(s): BNP in the last 8760 hours.  ProBNP (last 3 results) No results for input(s): PROBNP in the last 8760 hours.  CBG:  Recent Labs Lab 06/09/15 1320 06/09/15 1731 06/09/15 2218 06/10/15 0732 06/10/15 1153  GLUCAP 214* 189* 116* 126* 228*       Signed:  Lilliah Priego MD, PhD  [facility] 06/10/2015, 3:46 PM

## 2015-06-10 NOTE — Progress Notes (Signed)
Inpatient Diabetes Program Recommendations  AACE/ADA: New Consensus Statement on Inpatient Glycemic Control (2015)  Target Ranges:  Prepandial:   less than 140 mg/dL      Peak postprandial:   less than 180 mg/dL (1-2 hours)      Critically ill patients:  140 - 180 mg/dL   Review of Glycemic Control Results for Sara Marshall, Sara Marshall (MRN [id number]) as of 06/10/2015 14:01  Ref. Range 06/09/2015 13:20 06/09/2015 17:31 06/09/2015 22:18 06/10/2015 07:32 06/10/2015 11:53  Glucose-Capillary Latest Ref Range: 65-99 mg/dL 409214 (H) 811189 (H) 914116 (H) 126 (H) 228 (H)   Diabetes history: DM Type 2 Outpatient Diabetes medications: No home meds Current orders for Inpatient glycemic control: Lantus 20 units q hs + Novolog Sensitive Correction Scale + Novolog 3 units meal coverage tid  Inpatient Diabetes Program Recommendations:  Note patient does not have insurance and will need inexpensive insulin @ time of discharge. Patient can purchase relion 70/30 insulin @ [facility] for $25 per vial. Based on blood sugar pattern and amount of insulin patient receiving in the hospital, recommend the following: 70/30 insulin 15 units bid with meals.  Thank you, Sara FischerJudy E. Orvell Careaga, RN, MSN, CDE Inpatient Glycemic Control Team Team Pager [phone] (8am-5pm) 06/10/2015 2:06 PM

## 2015-06-10 NOTE — Progress Notes (Signed)
Physical Therapy Wound Treatment Patient Details  Name: Sara Marshall MRN: [id number] Date of Birth: 1970-08-29  Today's Date: 06/10/2015 Time: 1761-6073 Time Calculation (min): 26 min  Subjective  Subjective: Pt agreeable to hydrotherapy and declines premedicating with pain medicine.  Patient and Family Stated Goals: Wounds to heal. (Daughter present during treatment) Date of Onset:  (2-3 days PTA)  Pain Score: Pt denies pain during session.   Wound Assessment  Wound / Incision (Open or Dehisced) 06/04/15 Other (Comment) Back Upper red/purple discoloration around small open area (Active)  Dressing Type Abdominal binder;Gauze (Comment);Moist to dry 06/10/2015  9:10 AM  Dressing Changed New 06/10/2015  9:10 AM  Dressing Status Clean;Dry;Intact 06/10/2015  9:10 AM  Dressing Change Frequency Daily 06/10/2015  9:10 AM  Site / Wound Assessment Bleeding;Pink;Red;Yellow;Brown;Black 06/10/2015  9:10 AM  % Wound base Red or Granulating 80% 06/10/2015  9:10 AM  % Wound base Yellow 20% 06/10/2015  9:10 AM  Peri-wound Assessment Erythema (blanchable);Induration;Intact 06/10/2015  9:10 AM  Wound Length (cm) 5.7 cm 06/07/2015  1:24 PM  Wound Width (cm) 4.4 cm 06/07/2015  1:24 PM  Wound Depth (cm) 1.9 cm 06/07/2015  1:24 PM  Undermining (cm) 3.9 at 2 o'clock, 2.8 at 4 o'clock, up to 1.0 everywhere else 06/07/2015  1:24 PM  Margins Unattached edges (unapproximated) 06/10/2015  9:10 AM  Closure None 06/10/2015  9:10 AM  Drainage Amount Copious 06/10/2015  9:10 AM  Drainage Description Purulent 06/10/2015  9:10 AM  Non-staged Wound Description Full thickness 06/10/2015  9:10 AM  Treatment Debridement (Selective);Hydrotherapy (Pulse lavage);Packing (Saline gauze) 06/10/2015  9:10 AM  Santyl applied to wound bed prior to applying dressing.    Hydrotherapy Pulsed lavage therapy - wound location: Upper back Pulsed Lavage with Suction (psi): 12 psi Pulsed Lavage with Suction - Normal Saline Used: 1000 mL Pulsed Lavage  Tip: Tip with splash shield Selective Debridement Selective Debridement - Location: Upper back Selective Debridement - Tools Used: Forceps;Scissors Selective Debridement - Tissue Removed: Yellow and black necrotic tissue   Wound Assessment and Plan  Wound Therapy - Assess/Plan/Recommendations Wound Therapy - Clinical Statement: Progressing with removal of non-viable tissue.  Wound Therapy - Functional Problem List: Acute pain, decreased tolerance of mobility. Factors Delaying/Impairing Wound Healing: Diabetes Mellitus;Tobacco use Hydrotherapy Plan: Debridement;Dressing change;Patient/family education;Pulsatile lavage with suction Wound Therapy - Frequency: 6X / week Wound Therapy - Follow Up Recommendations: Home health RN Wound Plan: See above  Wound Therapy Goals- Improve the function of patient's integumentary system by progressing the wound(s) through the phases of wound healing (inflammation - proliferation - remodeling) by: Decrease Necrotic Tissue to: 0% Decrease Necrotic Tissue - Progress: Progressing toward goal Increase Granulation Tissue to: 100% Increase Granulation Tissue - Progress: Progressing toward goal Goals/treatment plan/discharge plan were made with and agreed upon by patient/family: Yes Time For Goal Achievement: 7 days Wound Therapy - Potential for Goals: Good  Goals will be updated until maximal potential achieved or discharge criteria met.  Discharge criteria: when goals achieved, discharge from hospital, MD decision/surgical intervention, no progress towards goals, refusal/missing three consecutive treatments without notification or medical reason.  GP     Rolinda Roan 06/10/2015, 9:16 AM   Rolinda Roan, PT, DPT Acute Rehabilitation Services Pager: [phone]

## 2015-07-08 ENCOUNTER — Encounter: Payer: Self-pay | Admitting: Family Medicine

## 2015-07-08 ENCOUNTER — Ambulatory Visit (INDEPENDENT_AMBULATORY_CARE_PROVIDER_SITE_OTHER): Payer: Self-pay | Admitting: Family Medicine

## 2015-07-08 VITALS — BP 136/77 | HR 81 | Temp 98.0°F | Resp 16 | Ht 63.0 in | Wt 174.0 lb

## 2015-07-08 DIAGNOSIS — Z87891 Personal history of nicotine dependence: Secondary | ICD-10-CM | POA: Insufficient documentation

## 2015-07-08 DIAGNOSIS — E119 Type 2 diabetes mellitus without complications: Secondary | ICD-10-CM

## 2015-07-08 DIAGNOSIS — Z23 Encounter for immunization: Secondary | ICD-10-CM

## 2015-07-08 LAB — CBC WITH DIFFERENTIAL/PLATELET
BASOS PCT: 0 %
Basophils Absolute: 0 cells/uL (ref 0–200)
EOS PCT: 4 %
Eosinophils Absolute: 236 cells/uL (ref 15–500)
HCT: 37.3 % (ref 35.0–45.0)
Hemoglobin: 12.2 g/dL (ref 11.7–15.5)
LYMPHS ABS: 1888 {cells}/uL (ref 850–3900)
LYMPHS PCT: 32 %
MCH: 30.2 pg (ref 27.0–33.0)
MCHC: 32.7 g/dL (ref 32.0–36.0)
MCV: 92.3 fL (ref 80.0–100.0)
MPV: 10.9 fL (ref 7.5–12.5)
Monocytes Absolute: 354 cells/uL (ref 200–950)
Monocytes Relative: 6 %
NEUTROS PCT: 58 %
Neutro Abs: 3422 cells/uL (ref 1500–7800)
PLATELETS: 221 10*3/uL (ref 140–400)
RBC: 4.04 MIL/uL (ref 3.80–5.10)
RDW: 14.2 % (ref 11.0–15.0)
WBC: 5.9 10*3/uL (ref 3.8–10.8)

## 2015-07-08 LAB — POCT URINALYSIS DIP (DEVICE)
BILIRUBIN URINE: NEGATIVE
GLUCOSE, UA: NEGATIVE mg/dL
Hgb urine dipstick: NEGATIVE
Ketones, ur: NEGATIVE mg/dL
LEUKOCYTES UA: NEGATIVE
NITRITE: NEGATIVE
Protein, ur: NEGATIVE mg/dL
Specific Gravity, Urine: 1.015 (ref 1.005–1.030)
UROBILINOGEN UA: 0.2 mg/dL (ref 0.0–1.0)
pH: 7 (ref 5.0–8.0)

## 2015-07-08 LAB — GLUCOSE, CAPILLARY: GLUCOSE-CAPILLARY: 132 mg/dL — AB (ref 65–99)

## 2015-07-08 NOTE — Progress Notes (Signed)
Subjective:    Patient ID: Sara QuantCruz Kreider, female    DOB: 12/20/1970, 45 y.o.   MRN: [id number]  HPI  Mr. Sara Marshall, a [age] year old with a history of diabetes mellitus type 2 diabetes presents to establish care. Patient primarily speaks spanish, daughter is assisting with interpreting. Patient was recently hospitalized on 06/03/2015 with a left abdominal abscess and uncontrolled diabetes mellitus. [facility] and is scheduled to follow up in June. Patient was found to have uncontrolled diabetes while hospitalized. She had been taking her mother's medication to control diabetes mellitus. She was discharged home on Novalin 70/30 and has been taking medication consistently. Previously hemoglobin a1c was 12.5 %.  Symptoms include paresthesia of the feet and polyuria. Symptoms have been intermittent. Patient denies polydipsia, visual disturbances, vomitting and weight loss.   Past Medical History  Diagnosis Date  . Diabetes mellitus without complication (HCC)   . Tobacco abuse   . Abscess    Social History   Social History  . Marital Status: Single    Spouse Name: N/A  . Number of Children: N/A  . Years of Education: N/A   Occupational History  . Not on file.   Social History Main Topics  . Smoking status: Current Every Day Smoker  . Smokeless tobacco: Not on file  . Alcohol Use: No  . Drug Use: No  . Sexual Activity: Not on file   Other Topics Concern  . Not on file   Social History Narrative   Past Surgical History  Procedure Laterality Date  . Incision and drainage abscess N/A 06/05/2015    Procedure: INCISION AND DRAINAGE ABSCESS/ABDOMEN AND BACK;  Surgeon: Manus RuddMatthew Tsuei, MD;  Location: [facility] OR;  Service: General;  Laterality: N/A;   Immunization History  Administered Date(s) Administered  . Influenza,inj,Quad PF,36+ Mos 06/09/2015    Review of Systems  Constitutional: Positive for fatigue. Negative for fever.  HENT: Negative.   Eyes:  Negative.  Negative for photophobia and visual disturbance.  Respiratory: Negative.   Cardiovascular: Negative.   Gastrointestinal: Negative.  Negative for diarrhea and constipation.  Endocrine: Positive for polyuria. Negative for cold intolerance, heat intolerance, polydipsia and polyphagia.  Genitourinary: Negative.   Musculoskeletal: Negative.   Skin: Negative.   Allergic/Immunologic: Negative.  Negative for immunocompromised state.  Neurological: Positive for numbness (left great toe).  Hematological: Negative.   Psychiatric/Behavioral: Negative.  Negative for suicidal ideas and sleep disturbance.       Objective:   Physical Exam  Constitutional: She is oriented to person, place, and time. She appears well-developed and well-nourished.  HENT:  Head: Normocephalic and atraumatic.  Right Ear: External ear normal.  Left Ear: External ear normal.  Mouth/Throat: Oropharynx is clear and moist.  Eyes: Conjunctivae and EOM are normal. Pupils are equal, round, and reactive to light.  Neck: Normal range of motion. Neck supple.  Cardiovascular: Normal rate, regular rhythm, normal heart sounds and intact distal pulses.   Pulmonary/Chest: Effort normal and breath sounds normal.  Abdominal: Soft. Bowel sounds are normal.  Musculoskeletal: Normal range of motion.  Neurological: She is alert and oriented to person, place, and time. She has normal reflexes.  Skin: Skin is warm and dry.  Psychiatric: She has a normal mood and affect. Her behavior is normal. Judgment and thought content normal.     BP 136/77 mmHg  Pulse 81  Temp(Src) 98 F (36.7 C) (Oral)  Resp 16  Ht 5\' 3"  (1.6 m)  Wt  174 lb (78.926 kg)  BMI 30.83 kg/m2  SpO2 100%  LMP 05/29/2015 Assessment & Plan:  1. Diabetes mellitus without complication (HCC) Glucose is 132, patient has been taking 70/30 15 units consistently with meals. I will review laboratory results and follow up with patient by phone for possible medication  changes. Discussed diet and exercise at length. Patient to continue carbohydrate modified diet and exercise 150 minutes per week.  - Glucose (CBG) - POCT urinalysis dipstick - COMPLETE METABOLIC PANEL WITH GFR - Hemoglobin A1c - CBC with Differential - Glucose, capillary - POCT urinalysis dip (device) - Tdap vaccine greater than or equal to 7yo IM - Pneumococcal polysaccharide vaccine 23-valent greater than or equal to 2yo subcutaneous/IM  2. Former cigarette smoker Smoking cessation instruction/counseling given:  commended patient for quitting and reviewed strategies for preventing relapses  3. Immunization due - Pneumococcal polysaccharide vaccine 23-valent greater than or equal to 2yo subcutaneous/IM  4. Need for Tdap vaccination - Tdap vaccine greater than or equal to 7yo IM   Routine Health Maintenance:  Recommend pap smear in 1 month Recommend routine screening mammogram.   RTC: 1 month for DMII Merced Brougham M, FNP

## 2015-07-08 NOTE — Patient Instructions (Signed)
Diabetes y actividad fsica (Diabetes and Exercise) Hacer actividad fsica con regularidad es muy importante. No se trata solo de perder peso. Tiene muchos otros beneficios, como por ejemplo:  Mejorar el estado fsico, la flexibilidad y la resistencia.  Aumenta la densidad sea.  Ayuda a controlar el peso.  Disminuye la grasa corporal.  Aumenta la fuerza muscular.  Reduce el estrs y las tensiones.  Mejora el estado de salud general. Las personas diabticas que realizan actividad fsica tienen beneficios adicionales debido al ejercicio:  Reduce el apetito.  El organismo mejora el uso del azcar (glucosa) de la sangre.  Ayuda a disminuir o controlar la glucosa en la sangre.  Disminuye la presin arterial.  Ayuda a disminuir los lpidos en la sangre (colesterol y triglicridos).  El organismo mejora el uso de la insulina porque:  Aumenta la sensibilidad del organismo a la insulina.  Reduce las necesidades de insulina del organismo.  Disminuye el riesgo de enfermedad cardaca por la actividad fsica ya que  disminuye el colesterol y tambin los triglicridos.  Aumenta los niveles de colesterol bueno (como las lipoprotenas de alta densidad [HDL]) en el organismo.  Disminuye los niveles de glucosa en la sangre. SU PLAN DE ACTIVIDAD  Elija una actividad que disfrute y establezca objetivos realistas. Para ejercitarse sin riesgos, debe comenzar a practicar cualquier actividad fsica nueva lentamente y aumentar la intensidad del ejercicio de forma gradual con el tiempo. Su mdico o educador en diabetes podrn ayudarlo a crear un plan de actividades que lo beneficie. Las recomendaciones generales incluyen lo siguiente:  Alentar a los nios para que realicen al menos 60 minutos de actividad fsica cada da.  Estirarse y realizar ejercicios de entrenamiento de la fuerza, como yoga o levantamiento de pesas, por lo menos 2 veces por semana.  Realizar en total por lo menos 150  minutos de ejercicios de intensidad moderada cada semana, como caminar a paso ligero o hacer gimnasia acutica.  Hacer ejercicio fsico por lo menos 3 das por semana y no dejar pasar ms de 2 das seguidos sin ejercitarse.  Evitar los perodos largos de inactividad (90 minutos o ms tiempo). Cuando deba pasar mucho tiempo sentado, haga pausas frecuentes para caminar o estirarse. RECOMENDACIONES PARA REALIZAR EJERCICIOS CUANDO SE TIENE DIABETES TIPO 1 O TIPO 2   Controle la glucosa en la sangre antes de comenzar. Si el nivel de glucosa en la sangre es de ms de 240 mg/dl, controle las cetonas en la orina. No haga actividad fsica si hay cetonas.  Evite inyectarse insulina en las zonas del cuerpo que ejercitar. Por ejemplo, evite inyectarse insulina en:  Los brazos, si juega al tenis.  Las piernas, si corre.  Lleve un registro de:  Los alimentos que consume antes y despus de hacer el ejercicio.  Los momentos esperables de picos de accin de la insulina.  Los niveles de glucosa en la sangre antes y despus de hacer ejercicios.  El tipo y cantidad de actividad fsica que realiza.  Revise los registros con su mdico. El mdico lo ayudar a desarrollar pautas para ajustar la cantidad de alimento y las cantidades de insulina antes y despus de hacer ejercicios.  Si toma insulina o agentes hipoglucemiantes por va oral, observe si hay signos y sntomas de hipoglucemia. Entre los que se incluyen:  Mareos.  Temblores.  Sudoracin.  Escalofros.  Confusin.  Beba gran cantidad de agua mientras hace ejercicios para evitar la deshidratacin o los golpes de calor. Durante la actividad fsica se pierde   agua corporal que se debe reponer.  Comente con su mdico antes de comenzar un programa de actividad fsica para verificar que sea seguro para usted. Recuerde, cualquier actividad es mejor que ninguna.   Esta informacin no tiene Marine scientist el consejo del mdico. Asegrese de  hacerle al mdico cualquier pregunta que tenga.   Document Released: 03/22/2007 Document Revised: 07/17/2014 Elsevier Interactive Patient Education [employer]. Diabetes y cuidados del pie (Diabetes and Foot Care) La diabetes puede ser la causa de que el flujo sanguneo (circulacin) en las piernas y los pies sea deficiente. Debido a esto, la piel de los pies se torna ms delgada, se rompe con facilidad y se cura ms lentamente. La piel puede estar seca, despellejarse y Medical illustrator. Tambin pueden estar daados los nervios de las piernas y de los pies lo que provoca una disminucin de la sensibilidad. Es posible que no advierta heridas ms pequeas en los pies, que pueden causar infecciones graves. Cuidar sus pies es una de las cosas ms importantes que puede hacer por usted mismo.  INSTRUCCIONES PARA EL CUIDADO EN EL HOGAR  Use siempre calzado, an dentro de su casa. No camine descalzo. Caminar descalzo facilita que se lastime.  Controle sus pies diariamente para observar ampollas, cortes y enrojecimiento. Si no puede ver la planta del pie, use un espejo o pdale ayuda a Nurse, children's.  Lave sus pies con agua tibia (no use agua caliente) y un [place]. Seque bien sus pies, y la zona [employer] dedos dando [place], hasta que estn completamente secos. Noremoje los pies, ya que esto puede resecar la piel.  Aplique una locin hidratante o vaselina (que no contenga alcohol ni perfume) en los pies y en las uas secas y [place]. No aplique locin entre los dedos.  Recorte las uas en forma recta. No escarbe debajo de las uas o alrededor [employer]. Lime los bordes de las uas con una lima o esmeril.  No corte las durezas o callosidades, ni trate de quitarlas con medicamentos.  Use calcetines de algodn o medias [employer]. Asegrese de que no le [employer]. Nouse calcetines que le lleguen a las rodillas, ya que podran disminuir el flujo de sangre a las  piernas.  Use zapatos de cuero que le queden bien y que sean acolchados. Para amoldar los zapatos, clcelos slo algunas horas por da. Esto evitar lesiones en los pies. Revise siempre los zapatos antes de ponerlos para asegurarse de que no haya objetos en su interior.  No cruce las piernas. Esto puede disminuir el flujo de sangre a los pies.  Si algo le ha raspado, cortado o lastimado la piel de los pies, mantenga la piel de esa zona limpia y [place]. Debe higienizar estas zonas con agua y un jabn suave. No limpie la zona con agua oxigenada, alcohol ni yodo.  Cuando se quite un vendaje adhesivo, asegrese de no daar la piel.  Si tiene una herida, obsrvela varias veces por da para asegurarse de que se est curando.  No use bolsas de agua caliente ni almohadillas trmicas. Podran causar quemaduras. Si ha perdido la sensibilidad en los pies o las piernas, no sabr lo que le est sucediendo hasta que sea demasiado tarde.  Asegrese de que su mdico le haga un examen completo de los pies por lo menos una vez al ao, o con ms frecuencia si usted tiene [employer]. Informe todos los cortes, llagas o moretones a su mdico inmediatamente.  SOLICITE ATENCIN MDICA SI:   Tiene una lesin que no se cura.  Tiene cortes o rajaduras en la piel.  Tiene una ua encarnada.  Nota una zona irritada en las piernas o los pies.  Siente una sensacin de ardor u hormigueo en las piernas o los pies.  Siente dolor o calambres en las piernas o los pies.  Las piernas o los pies estn adormecidos.  Siente los pies siempre fros. SOLICITE ATENCIN MDICA DE INMEDIATO SI:   Presenta enrojecimiento, hinchazn o aumento del dolor en una herida.  Nota una lnea roja que sube por pierna.  Aparece pus en la herida.  Le sube la fiebre o segn lo que le indique el mdico.  Advierte un olor ftido que proviene de una lcera o una herida.   Esta informacin no tiene Theme park manager el consejo  del mdico. Asegrese de hacerle al mdico cualquier pregunta que tenga.   Document Released: 03/02/2005 Document Revised: 11/02/2012 Elsevier Interactive Patient Education 2016 [employer]. Diabetes y planificacin de los das por enfermedad (Diabetes and Sick Day Management) Los niveles de azcar en la sangre (glucosa) pueden ser ms difciles de controlar cuando se est enfermo. Los resfros, la fiebre, la gripe, las nuseas, los vmitos y la diarrea son ejemplos de enfermedades comunes que pueden causar problemas para las personas con diabetes. La prdida de lquidos corporales (deshidratacin) por fiebre, vmitos, diarrea e infecciones, y el estrs de [place] enfermedad pueden hacer que los niveles de glucosa en la [place]. Por este motivo, es muy importante que tome sus medicamentos para la diabetes y que coma algn alimento con carbohidratos cuando est enfermo. Los alimentos lquidos o blandos generalmente son Lenoria Chime, y [place] a [employer] lquidos. INSTRUCCIONES PARA EL CUIDADO EN EL HOGAR Las pautas principales estn destinadas al control de una enfermedad de corta (24 horas o menos) duracin:  Aplquese su dosis habitual de insulina o los medicamentos por va oral para la diabetes. Una excepcin sera si usted toma algn medicamento con metformina. Si no puede comer o beber, puede deshidratarse y no debe tomar [employer].  Siga recibiendo la insulina incluso cuando no pueda comer alimentos slidos o vomite. Las dosis de [employer] seguir siendo las mismas o puede ser necesario aumentarlas.  Debe controlar con ms frecuencia los niveles de glucosa en la sangre, generalmente cada 2 a 4 horas. Si tiene diabetes tipo 1, deber hacerse anlisis de cetonas en la orina cada 4 horas. Si sufre diabetes tipo 2, controle las cetonas en la orina segn las indicaciones del mdico.  Consuma alimentos que contengan carbohidratos, en cualquier forma. Los carbohidratos pueden  estar en alimentos slidos o lquidos. Debe consumir entre 45 y 50 gramos de carbohidratos cada 3 o 4 horas.  Reponga lquidos si tiene fiebre, vomita o tiene [place]. Pida instrucciones especficas a su mdico con respecto a la rehidratacin.  Si tiene diabetes tipo 1 observe estrictamente si tiene signos de cetoacidosis. Comunquese con su mdico si se presenta alguno de estos sntomas, especialmente en los nios:  Una cifra moderada o elevada de cetonas en la orina, junto con un nivel elevado de glucosa en la [place].  Nuseas intensas.  Vmitos.  Diarrea.  Dolor abdominal.  Respiracin rpida.  Beba ms cantidad de lquidos que no contengan azcar [employer].  Tenga cuidado con los medicamentos de [address]. Lea las etiquetas. Pueden contener azcar u otros tipos de azcares que elevarn su nivel de glucosa en la sangre. Eleccin de  alimentos para cuando est enfermo Todos los alimentos que puede elegir de la lista siguiente contienen alrededor de 15 gramos de carbohidratos. Planifique con anticipacin y tenga algunos de estos alimentos a mano.    a  de taza de alguna bebida gaseosa que contenga azcar. Las gaseosas generalmente son mejor toleradas si se abren y se dejan a temperatura ambiente durante algunos minutos.   helado de agua doble.   taza de gelatina comn.   taza de jugo.   taza de helado de crema o yogur helado.   taza de cereal cocido.   taza de sorbete.  1 taza de caldo claro o sopa.  1 taza de sopa de crema.   taza de natilla comn.   taza de budn comn.  1 taza de bebida deportiva.  1 taza de yogur natural.  1 rebanada de pan tostado.  6 galletitas de agua.  5 unidades de vainillas. SOLICITE ATENCIN MDICA SI:   No puede beber lquidos ni siquiera en pequeas cantidades.  Tuvo nuseas o ha vomitado durante ms de 6 horas.  Tuvo diarrea durante ms de 6 horas.  Su nivel de glucosa en la sangre es ms de 240 mg/dl, an con  insulina adicional.  Presenta algn cambio en el estado mental.  Desarrolla una enfermedad grave adicional.  Su enfermedad dura ms de 2 das y no mejora.  Tiene fiebre. SOLICITE ATENCIN MDICA DE INMEDIATO SI:  Tiene dificultad para respirar.  Tiene niveles de cetonas moderados a altos. ASEGRESE DE QUE:  Comprende estas instrucciones.  Controlar su afeccin.  Recibir ayuda de inmediato si no mejora o si empeora.   Esta informacin no tiene Theme park manager el consejo del mdico. Asegrese de hacerle al mdico cualquier pregunta que tenga.   Document Released: 03/02/2005 Document Revised: 03/23/2014 Elsevier Interactive Patient Education 2016 [employer]. La diabetes mellitus y los alimentos (Diabetes Mellitus and Food) Es importante que controle su nivel de azcar en la sangre (glucosa). El nivel de glucosa en sangre depende en gran medida de lo que usted come. Comer alimentos saludables en las cantidades [place] a lo largo del Futures trader, aproximadamente a la misma hora [employer], lo ayudar a Chief Operating Officer su nivel de Event organiser. Tambin puede ayudarlo a retrasar o Fish farm manager de la diabetes mellitus. Comer de [employer] saludable incluso puede ayudarlo a Event organiser de presin arterial y a Barista o Pharmacologist un peso saludable.  Entre las recomendaciones generales para alimentarse y Water quality scientist los alimentos de forma saludable, se incluyen las siguientes:  Respetar las comidas principales y comer colaciones con regularidad. Evitar pasar largos perodos sin comer con el fin de perder peso.  Seguir una dieta que consista principalmente en alimentos de origen vegetal, como frutas, vegetales, frutos secos, legumbres y cereales integrales.  Utilizar mtodos de coccin a baja temperatura, como hornear, en lugar de mtodos de coccin a alta temperatura, como frer en abundante aceite. Trabaje con el nutricionista para aprender a Acupuncturist nutricional de  las etiquetas de los alimentos. CMO PUEDEN AFECTARME LOS ALIMENTOS? Carbohidratos Los carbohidratos afectan el nivel de glucosa en sangre ms que cualquier otro tipo de alimento. El nutricionista lo ayudar a Chief Strategy Officer cuntos carbohidratos puede consumir en cada comida y ensearle a contarlos. El recuento de carbohidratos es importante para mantener la glucosa en sangre en un nivel saludable, en especial si utiliza insulina o toma determinados medicamentos para la diabetes mellitus. Alcohol El alcohol puede provocar disminuciones sbitas de la glucosa en  sangre (hipoglucemia), en especial si utiliza insulina o toma determinados medicamentos para la diabetes mellitus. La hipoglucemia es una afeccin que puede poner en peligro la vida. Los sntomas de la hipoglucemia (somnolencia, mareos y Administratordesorientacin) son similares a los sntomas de haber consumido mucho alcohol.  Si el mdico lo autoriza a beber alcohol, hgalo con moderacin y siga estas pautas:  Las mujeres no deben beber ms de un trago por da, y los hombres no deben beber ms de dos tragos por Futures traderda. Un trago es igual a:  12 onzas (355 ml) de cerveza  5 onzas de vino (150 ml) de vino  1,5onzas (45ml) de bebidas espirituosas  No beba con el estmago vaco.  Mantngase hidratado. Beba agua, gaseosas dietticas o t helado sin azcar.  Las gaseosas comunes, los jugos y otros refrescos podran contener muchos carbohidratos y se Heritage managerdeben contar. QU ALIMENTOS NO SE RECOMIENDAN? Cuando haga las elecciones de alimentos, es importante que recuerde que todos los alimentos son distintos. Algunos tienen menos nutrientes que otros por porcin, aunque podran tener la misma cantidad de caloras o carbohidratos. Es difcil darle al cuerpo lo que necesita cuando consume alimentos con menos nutrientes. Estos son algunos ejemplos de alimentos que debera evitar ya que contienen muchas caloras y carbohidratos, pero pocos nutrientes:  NeurosurgeonGrasas trans  (la mayora de los alimentos procesados incluyen grasas trans en la etiqueta de Informacin nutricional).  Gaseosas comunes.  Jugos.  Caramelos.  Dulces, como tortas, pasteles, rosquillas y Tall Timbergalletas.  Comidas fritas. QU ALIMENTOS PUEDO COMER? Consuma alimentos ricos en nutrientes, que nutrirn el cuerpo y lo mantendrn saludable. Los alimentos que debe comer tambin dependern de varios factores, como:  Las caloras que necesita.  Los medicamentos que toma.  Su peso.  El nivel de glucosa en [place].  El [place]ivel de presin arterial.  El nivel de colesterol. Debe consumir una amplia variedad de alimentos, por ejemplo:  Protenas.  Cortes de Target Corporationcarne magros.  Protenas con bajo contenido de grasas saturadas, como pescado, clara de huevo y frijoles. Evite las carnes procesadas.  Frutas y vegetales.  Frutas y Sports administratorvegetales que pueden ayudar a Chief Operating Officercontrolar los niveles sanguneos de [place]glucosa, como [place]manzanas, mangos y batatas.  Productos lcteos.  Elija productos lcteos sin grasa o con bajo contenido de [place], como [place]le[place], yogur y [place].  Cereales, panes, pastas y arroz.  Elija cereales integrales, como panes multicereales, avena en grano y arroz integral. Estos alimentos pueden ayudar a controlar la presin arterial.  Rosalin HawkingGrasas.  Alimentos que contengan grasas saludables, como frutos secos, Chartered certified accountantaguacate, aceite de [place]oliva, aceite de canola y pescado. TODOS LOS QUE PADECEN DIABETES MELLITUS TIENEN EL MISMO PLAN DE COMIDAS? Dado que todas las personas que padecen diabetes mellitus son distintas, no hay un solo plan de comidas que funcione para todos. Es muy importante que se rena con un nutricionista que lo ayudar a crear un plan de comidas adecuado para usted.   Esta informacin no tiene Theme park managercomo fin reemplazar el consejo del mdico. Asegrese de hacerle al mdico cualquier pregunta que tenga.   Document Released: 06/09/2007 Document Revised: 03/23/2014 Elsevier Interactive Patient Education  [employer]2016 Elsevier Inc.

## 2015-07-09 ENCOUNTER — Other Ambulatory Visit: Payer: Self-pay | Admitting: Family Medicine

## 2015-07-09 DIAGNOSIS — E119 Type 2 diabetes mellitus without complications: Secondary | ICD-10-CM

## 2015-07-09 LAB — COMPLETE METABOLIC PANEL WITH GFR
ALK PHOS: 38 U/L (ref 33–115)
ALT: 11 U/L (ref 6–29)
AST: 12 U/L (ref 10–30)
Albumin: 3.9 g/dL (ref 3.6–5.1)
BILIRUBIN TOTAL: 0.4 mg/dL (ref 0.2–1.2)
BUN: 10 mg/dL (ref 7–25)
CO2: 22 mmol/L (ref 20–31)
Calcium: 8.6 mg/dL (ref 8.6–10.2)
Chloride: 106 mmol/L (ref 98–110)
Creat: 0.69 mg/dL (ref 0.50–1.10)
Glucose, Bld: 136 mg/dL — ABNORMAL HIGH (ref 65–99)
POTASSIUM: 4.1 mmol/L (ref 3.5–5.3)
SODIUM: 138 mmol/L (ref 135–146)
TOTAL PROTEIN: 6.6 g/dL (ref 6.1–8.1)

## 2015-07-09 LAB — HEMOGLOBIN A1C
Hgb A1c MFr Bld: 10.8 % — ABNORMAL HIGH (ref <5.7)
Mean Plasma Glucose: 263 mg/dL

## 2015-07-09 MED ORDER — METFORMIN HCL 1000 MG PO TABS: 500.0000 mg | ORAL_TABLET | Freq: Two times a day (BID) | ORAL | Status: AC

## 2015-07-09 MED ORDER — LISINOPRIL 5 MG PO TABS: 5.0000 mg | ORAL_TABLET | Freq: Every day | ORAL | Status: AC

## 2015-07-09 MED ORDER — INSULIN GLARGINE 100 UNIT/ML ~~LOC~~ SOLN: 10.0000 [IU] | Freq: Every day | SUBCUTANEOUS | Status: AC

## 2015-07-09 MED ORDER — ASPIRIN EC 81 MG PO TBEC: 81.0000 mg | DELAYED_RELEASE_TABLET | Freq: Every day | ORAL | Status: AC

## 2015-07-09 MED FILL — ?METFORMIN HCL 1,000 MG TAB: 1000 | 60 days supply | Qty: 60 | Fill #0

## 2015-07-09 MED FILL — !LANTUS SOLOSTAR 100UNITS/M: 100 | 30 days supply | Qty: 3 | Fill #0

## 2015-07-09 MED FILL — LISINOPRIL 5 MG TABLET: 5 | 30 days supply | Qty: 30 | Fill #0

## 2015-07-09 NOTE — Progress Notes (Signed)
Tried to call patient using Language Resources Line ID # [phone] called, phone is not in service at this time. I will try to call back later. Thanks!

## 2015-07-09 NOTE — Progress Notes (Signed)
Reviewed labs. Will discontinue Novalin 70/30. Will re-start Metformin 1000 mg BID. Sara Marshall was previously taking her mother's medications and never took Metformin consistently. Since hemoglobin a1C is greater than 9, will continue basal insulin. Will start 10 units HS. Will also start an ACE inhibitor and Aspirin therapy. Recommend a lowfat, low carbohydrate diet divided over 5-6 small meals, increase water intake to 6-8 glasses, and 150 minutes per week of cardiovascular exercise.  Patient was also encouraged to continue with smoking cessation. Sara Marshall will follow up in 1 month with glucometer.   Meds ordered this encounter  Medications  . metFORMIN (GLUCOPHAGE) 1000 MG tablet    Sig: Take 0.5 tablets (500 mg total) by mouth 2 (two) times daily with a meal.    Dispense:  60 tablet    Refill:  2  . aspirin EC 81 MG tablet    Sig: Take 1 tablet (81 mg total) by mouth daily.    Dispense:  30 tablet    Refill:  2  . lisinopril (PRINIVIL,ZESTRIL) 5 MG tablet    Sig: Take 1 tablet (5 mg total) by mouth daily.    Dispense:  30 tablet    Refill:  2  . insulin glargine (LANTUS) 100 UNIT/ML injection    Sig: Inject 0.1 mLs (10 Units total) into the skin at bedtime.    Dispense:  10 mL    Refill:  11    Raphel Stickles M, FNP

## 2015-07-10 NOTE — Progress Notes (Signed)
Called and spoke with patients daughter (patinet gave permission) advised of labs and need to start new medication regiment. Went over medication and instructions as well as diet suggested. Daughter verbalized understanding and is giving all the information to patient. Thanks!

## 2015-08-23 ENCOUNTER — Ambulatory Visit: Payer: Self-pay | Admitting: Family Medicine

## 2016-07-29 IMAGING — US US MISC SOFT TISSUE
1 series · 14 of 16 positions shown · non-contrast
Comparison: None.

CLINICAL DATA: Draining abscess to the middle of the back for 2
days.

EXAM:
ULTRASOUND OF CHEST SOFT TISSUES
TECHNIQUE: Ultrasound examination of the chest wall soft tissues was performed
in the area of clinical concern.

[Series 1: us misc soft tissue · 0.06mm/px · 20 acquisitions, 14 frames shown]
[im 1/20]
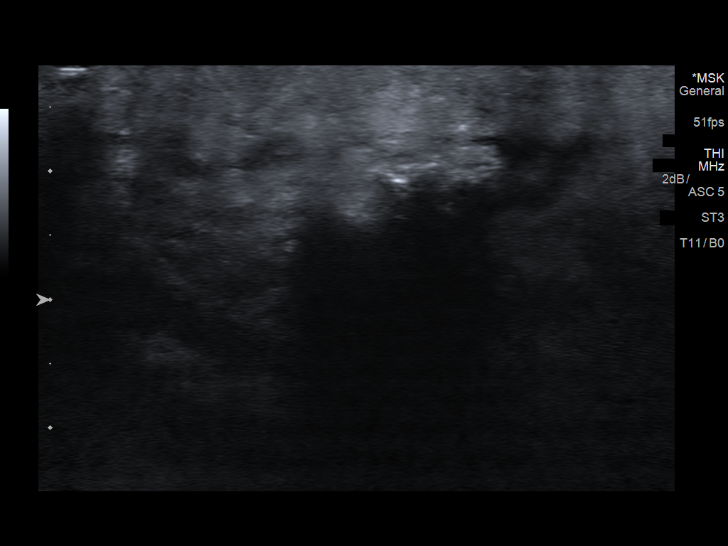
[im 2/20]
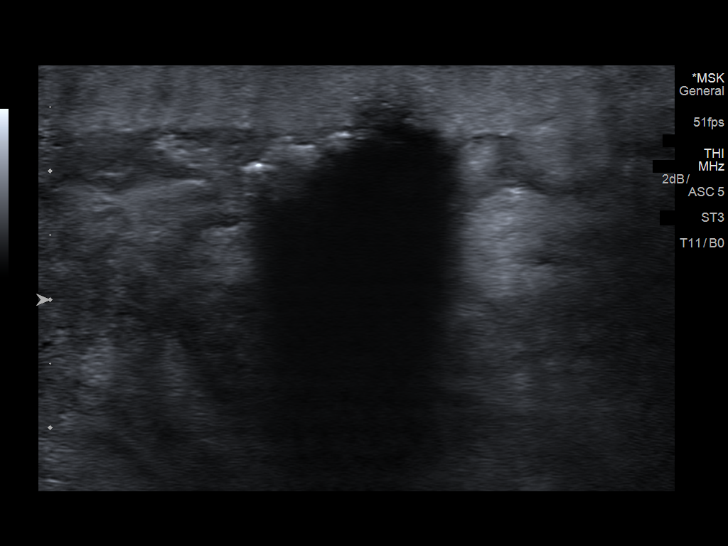
[im 3/20]
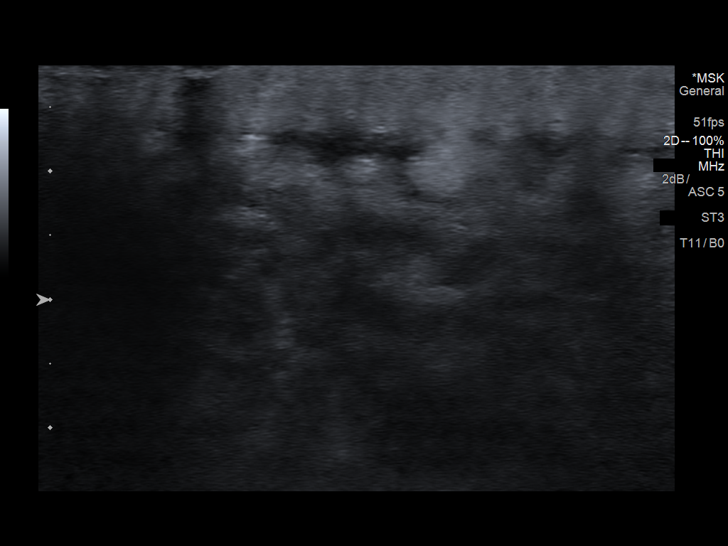
[im 6/20]
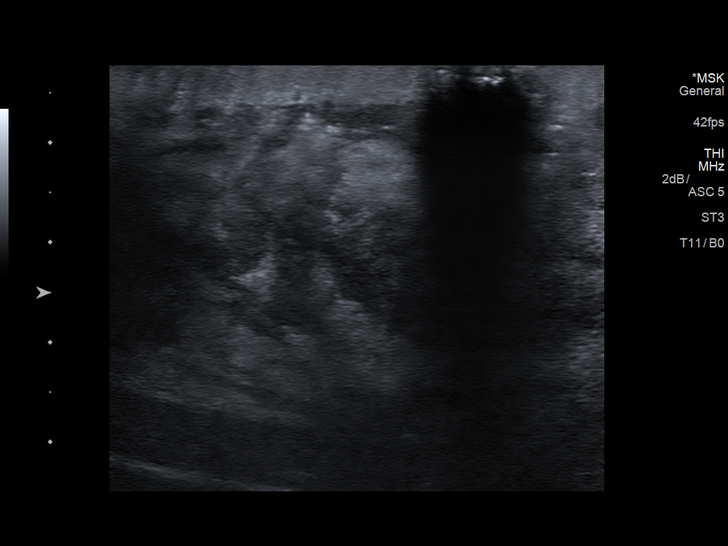
[im 7/20]
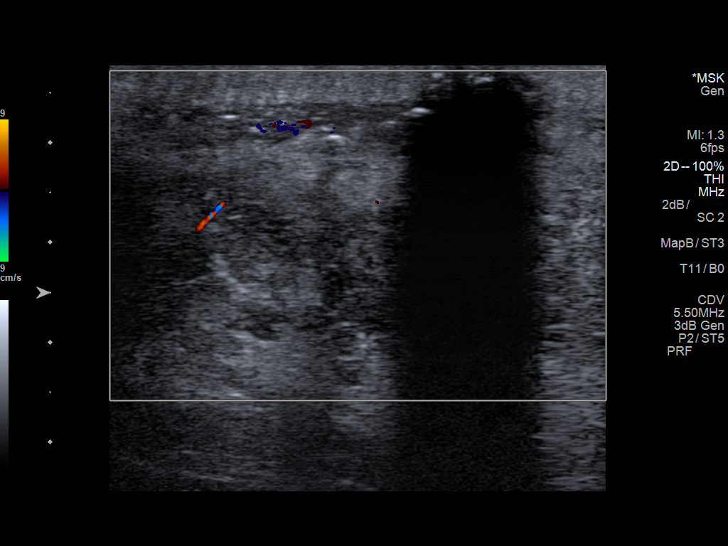
[im 8/20]
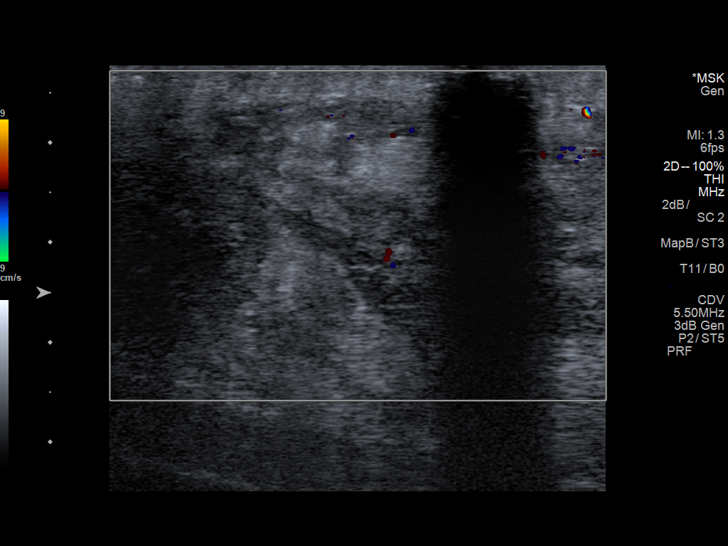
[im 9/20]
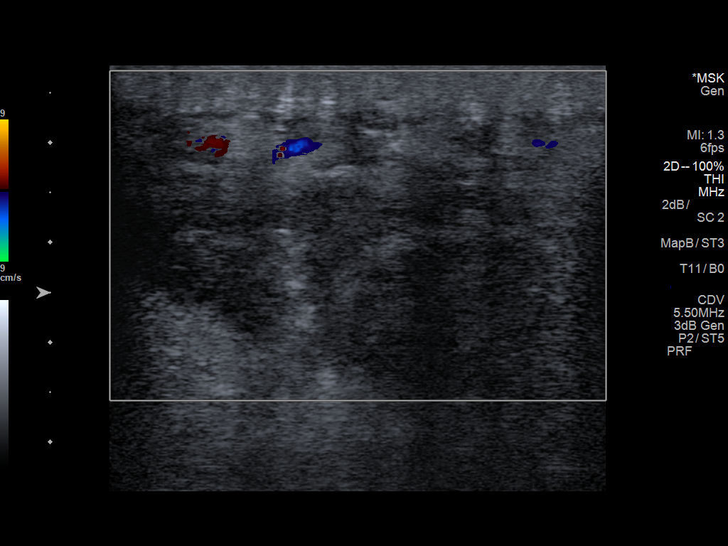
[im 11/20]
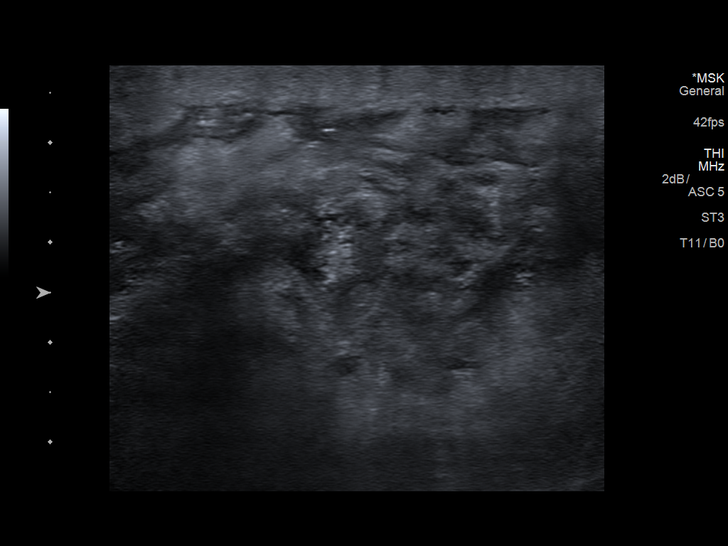
[im 12/20]
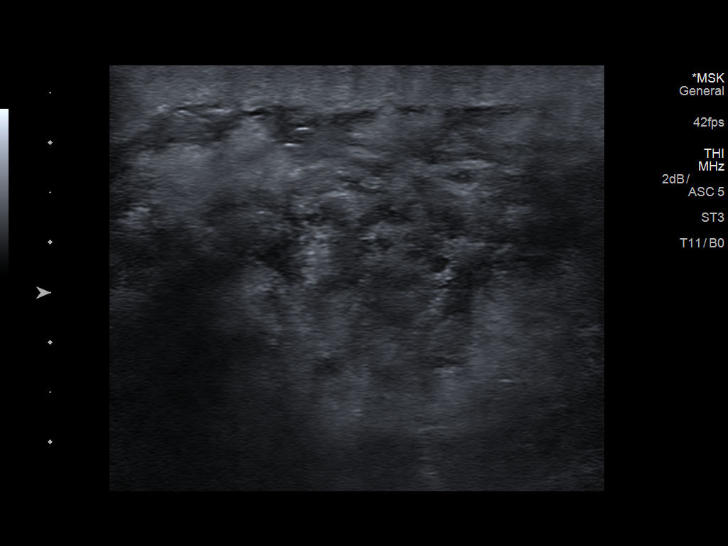
[im 13/20]
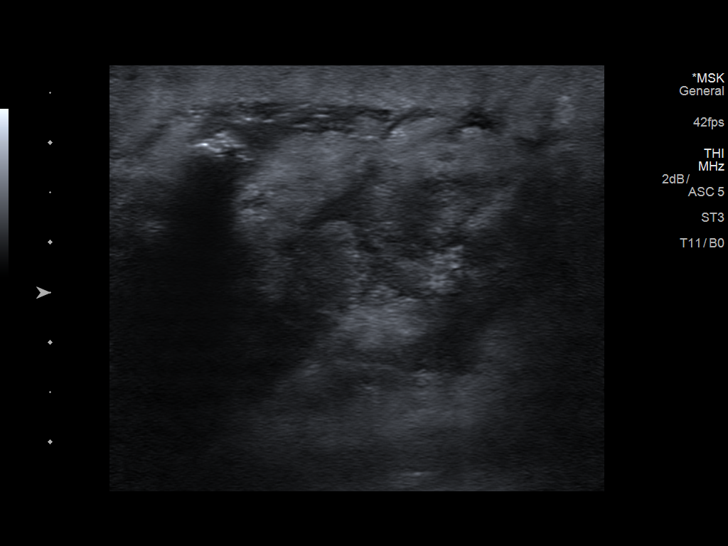
[im 16/20]
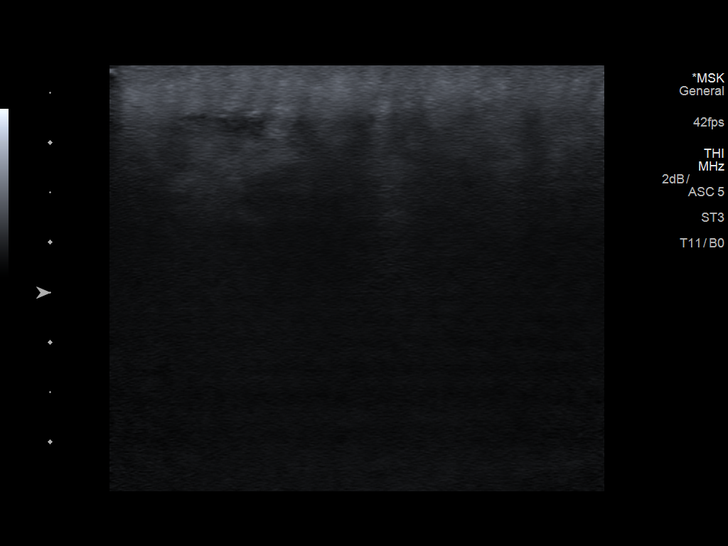
[im 17/20]
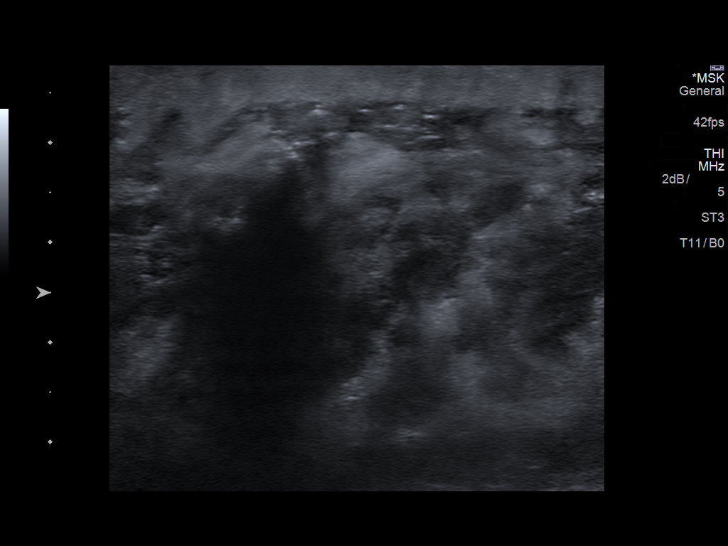
[im 18/20]
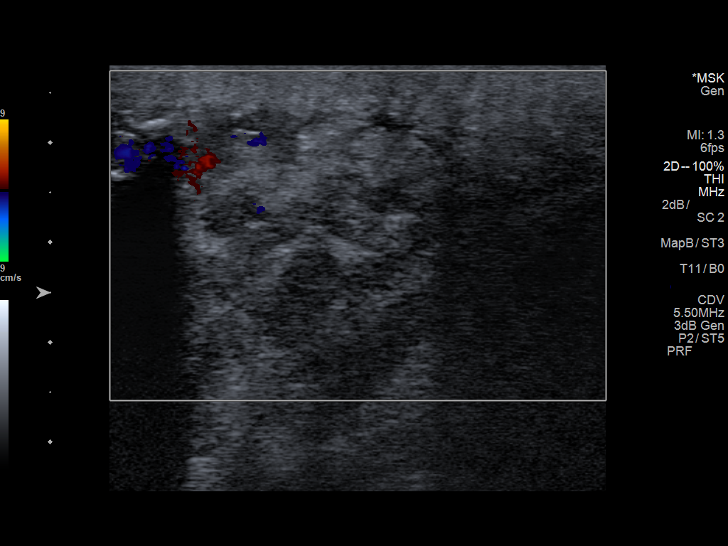
[im 20/20]
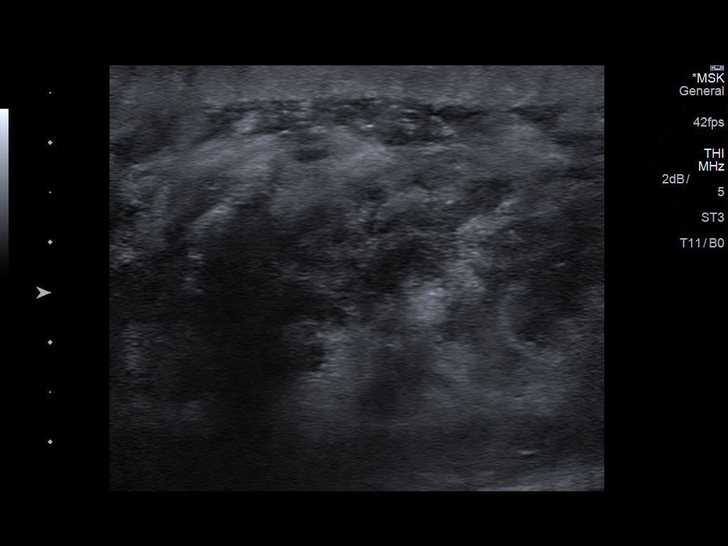

[14 of 16 positions shown; findings below may reference images not displayed]

FINDINGS: Ultrasound examination of the left upper back is obtained with
imaging of and area of swelling and redness. Images obtained
demonstrate subcutaneous soft tissue edema. No discrete loculated
fluid collection to suggest an abscess. Areas of mixed echotexture
with hyperechoic foci and shadowing consistent with soft tissue gas.
No significant hyperemia with color flow Doppler imaging.
IMPRESSION: Edema in the subcutaneous fat of the left upper back with soft
tissue gas collections present. Changes are consistent with
cellulitis. Soft tissue gas could be due to gas-forming organism or
fistula. No discrete abscess collection is identified.

## 2016-08-01 IMAGING — US US RENAL
1 series · 14 of 25 positions shown · non-contrast
Comparison: None.

CLINICAL DATA: Elevated creatinine.

EXAM:
RENAL / URINARY TRACT ULTRASOUND COMPLETE

[Series 1: us renal · 0.26mm/px · 14 of 33 slices shown]
[im 1/33]
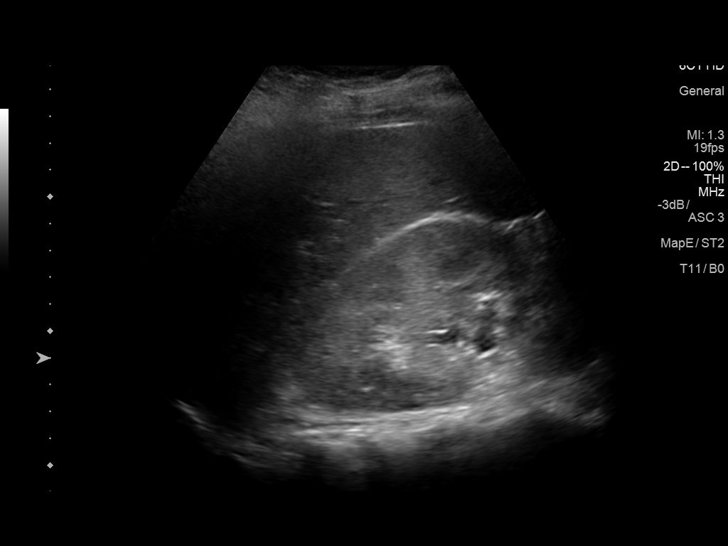
[im 3/33]
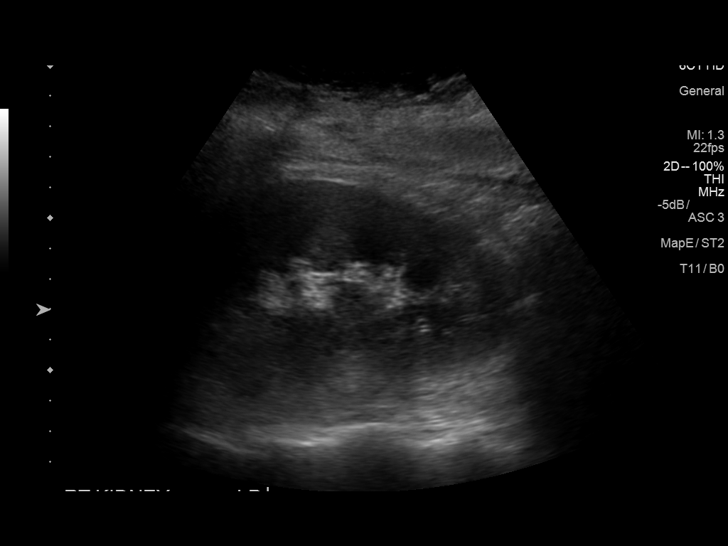
[im 6/33]
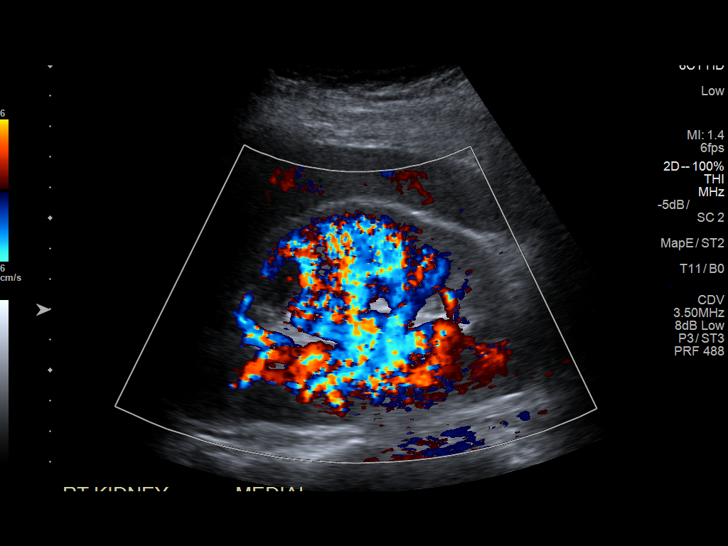
[im 9/33]
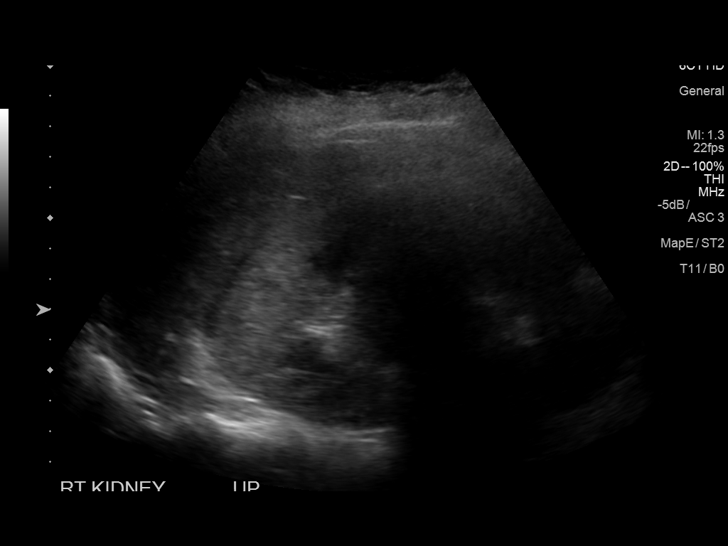
[im 11/33]
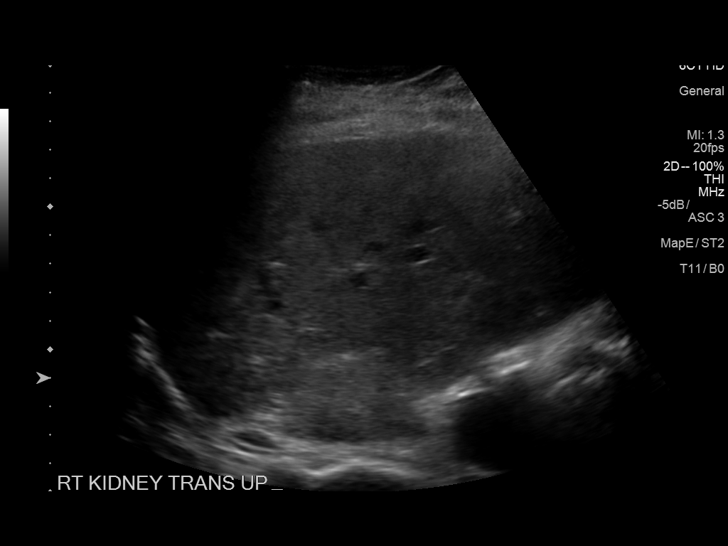
[im 13/33]
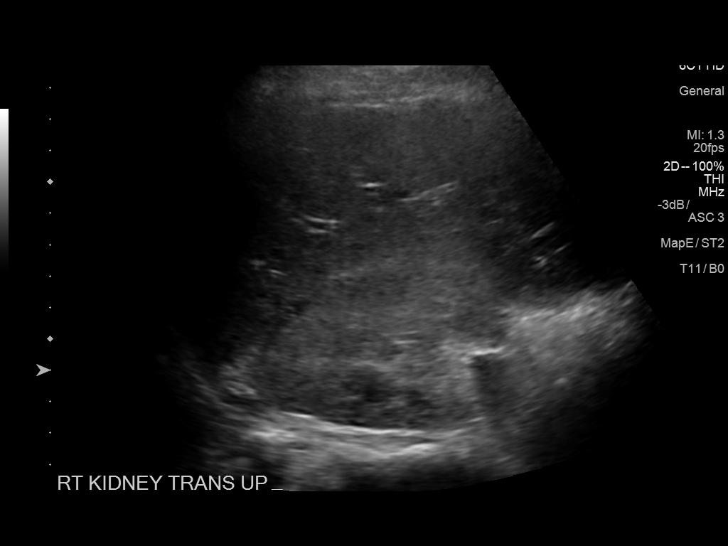
[im 15/33]
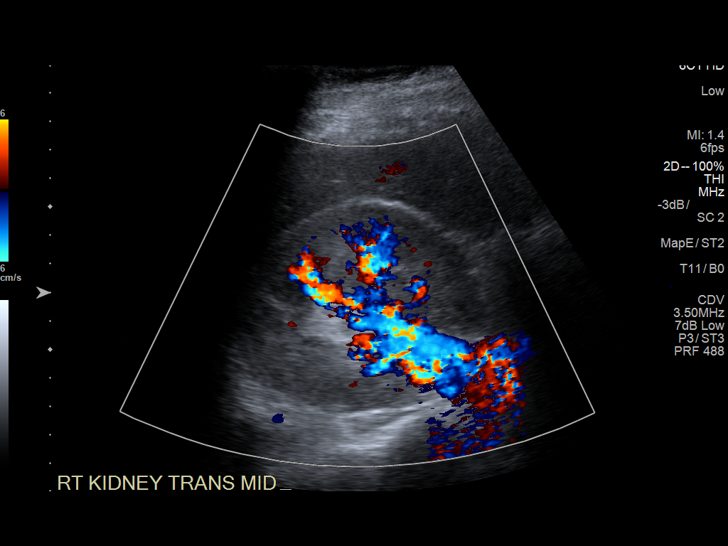
[im 18/33]
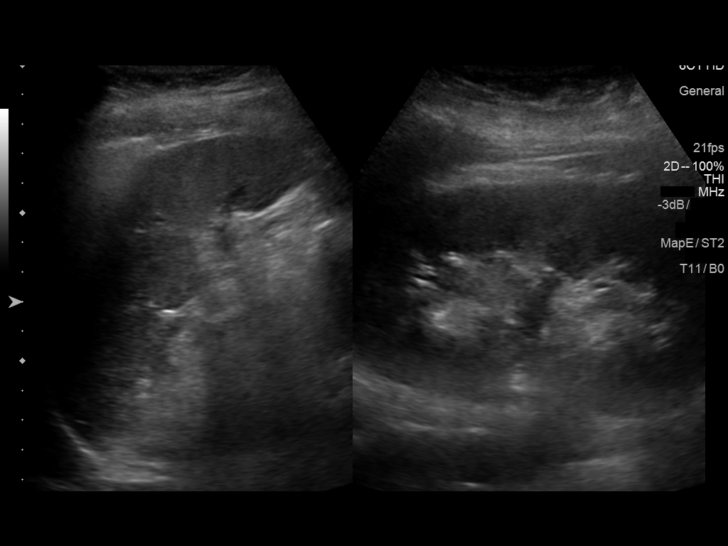
[im 21/33]
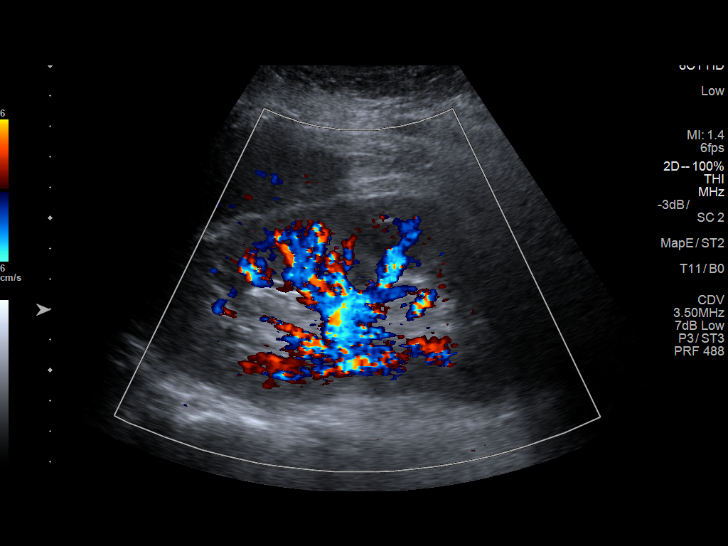
[im 22/33]
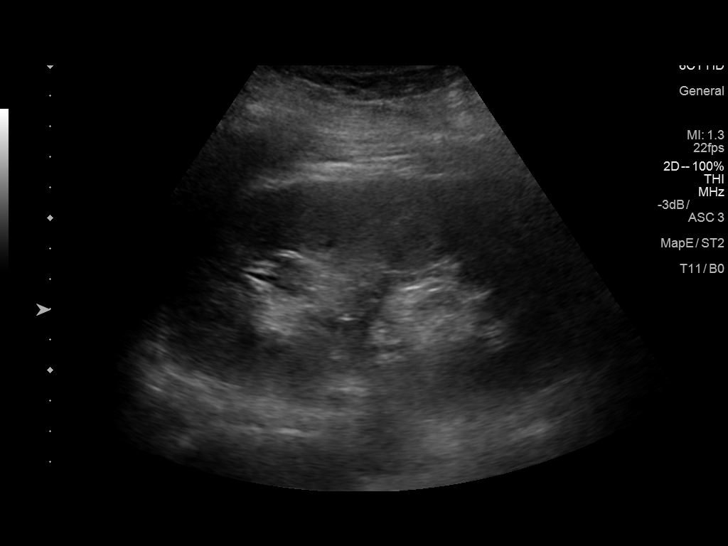
[im 25/33]
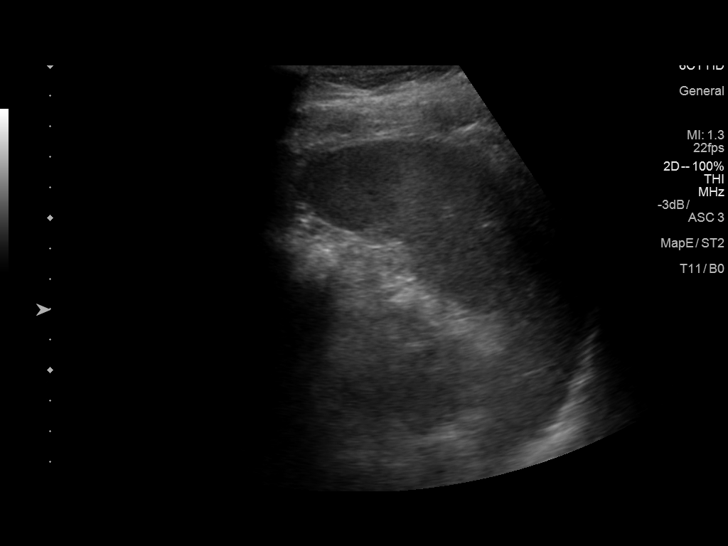
[im 27/33]
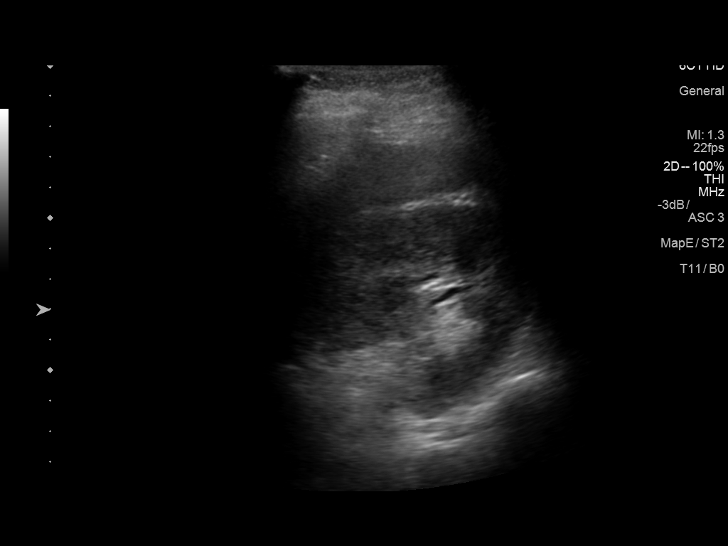
[im 30/33]
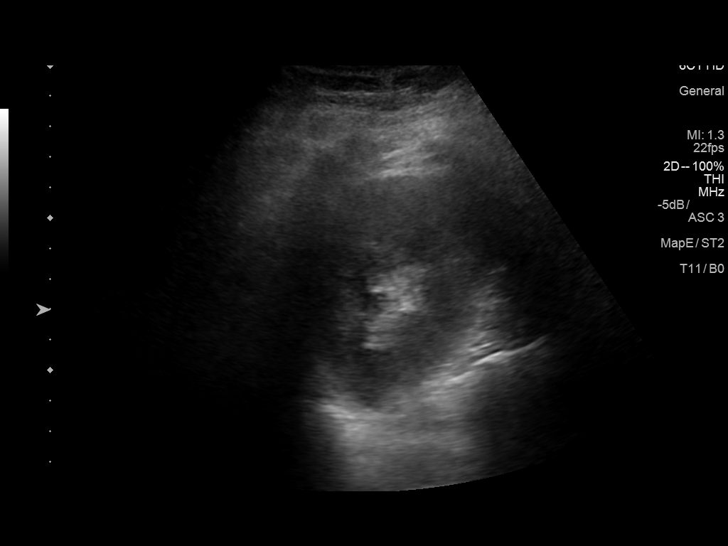
[im 33/33]
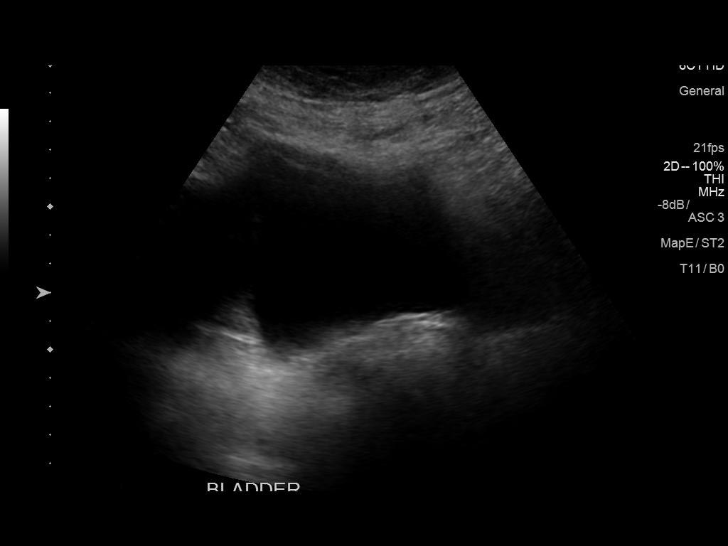

[14 of 25 positions shown; findings below may reference images not displayed]

FINDINGS: Right Kidney:

Length: 12.9 cm. Mildly increased parenchymal echogenicity. Trace
perinephric fluid adjacent to the upper goal. No mass or
hydronephrosis visualized.

Left Kidney:

Length: 13.6 cm. Minimally increased parenchymal echogenicity. No
mass or hydronephrosis visualized.

Bladder:

Appears normal for degree of bladder distention.
IMPRESSION: Mildly echogenic kidneys consistent with medical renal disease. No
hydronephrosis.
